# Patient Record
Sex: Female | Born: 1955 | Race: Black or African American | Hispanic: No | Marital: Married | Smoking: Former smoker
Health system: Southern US, Community
[De-identification: ages and names within clinical notes are randomized; demographics above are authoritative.]

## PROBLEM LIST (undated history)

## (undated) DIAGNOSIS — K219 Gastro-esophageal reflux disease without esophagitis: Secondary | ICD-10-CM

## (undated) DIAGNOSIS — G4734 Idiopathic sleep related nonobstructive alveolar hypoventilation: Secondary | ICD-10-CM

## (undated) DIAGNOSIS — N879 Dysplasia of cervix uteri, unspecified: Secondary | ICD-10-CM

## (undated) DIAGNOSIS — U071 COVID-19: Secondary | ICD-10-CM

## (undated) DIAGNOSIS — I2699 Other pulmonary embolism without acute cor pulmonale: Secondary | ICD-10-CM

## (undated) DIAGNOSIS — I10 Essential (primary) hypertension: Secondary | ICD-10-CM

## (undated) DIAGNOSIS — E785 Hyperlipidemia, unspecified: Secondary | ICD-10-CM

## (undated) DIAGNOSIS — M1990 Unspecified osteoarthritis, unspecified site: Secondary | ICD-10-CM

## (undated) DIAGNOSIS — J1282 Pneumonia due to coronavirus disease 2019: Secondary | ICD-10-CM

## (undated) DIAGNOSIS — M793 Panniculitis, unspecified: Secondary | ICD-10-CM

## (undated) DIAGNOSIS — R7303 Prediabetes: Secondary | ICD-10-CM

## (undated) DIAGNOSIS — F410 Panic disorder [episodic paroxysmal anxiety] without agoraphobia: Secondary | ICD-10-CM

## (undated) HISTORY — DX: Panic disorder (episodic paroxysmal anxiety): F41.0

## (undated) HISTORY — PX: THYROIDECTOMY, PARTIAL: SHX18

## (undated) HISTORY — PX: ABDOMINAL HYSTERECTOMY: SHX81

## (undated) HISTORY — DX: Hyperlipidemia, unspecified: E78.5

## (undated) HISTORY — PX: OTHER SURGICAL HISTORY: SHX169

## (undated) HISTORY — PX: CARPAL TUNNEL RELEASE: SHX101

## (undated) HISTORY — DX: Essential (primary) hypertension: I10

---

## 2020-03-23 ENCOUNTER — Other Ambulatory Visit: Payer: Self-pay | Admitting: Surgery

## 2020-03-24 ENCOUNTER — Other Ambulatory Visit (HOSPITAL_COMMUNITY): Payer: Self-pay | Admitting: Surgery

## 2020-03-31 ENCOUNTER — Ambulatory Visit (HOSPITAL_COMMUNITY): Payer: Self-pay

## 2020-03-31 ENCOUNTER — Encounter (HOSPITAL_COMMUNITY): Payer: Self-pay

## 2020-05-10 ENCOUNTER — Other Ambulatory Visit: Payer: Self-pay

## 2020-05-10 ENCOUNTER — Encounter: Payer: Medicare PPO | Attending: General Surgery | Admitting: Skilled Nursing Facility1

## 2020-05-10 ENCOUNTER — Encounter: Payer: Self-pay | Admitting: Skilled Nursing Facility1

## 2020-05-10 DIAGNOSIS — E669 Obesity, unspecified: Secondary | ICD-10-CM | POA: Diagnosis not present

## 2020-05-10 NOTE — Progress Notes (Signed)
Nutrition Assessment for Bariatric Surgery Medical Nutrition Therapy Appt Start Time: 7:30 End Time: 8:36  Patient was seen on 05/10/2020 for Pre-Operative Nutrition Assessment. Letter of approval faxed to [facility] Surgery bariatric surgery program coordinator on 05/10/2020.   Referral stated Supervised Weight Loss (SWL) visits needed: 0  Pt completed visits.  Pt has cleared nutrition requirements.  Planned surgery: sleeve gastrectomy  Pt expectation of surgery: to lose weight  Pt expectation of dietitian: to tell me how to eat    NUTRITION ASSESSMENT   Anthropometrics  Start weight at [facility]: 243.2 lbs (date: 05/10/2020)  Height: 62 in BMI: 44.48 kg/m2     Clinical  Medical hx: panic attacks, HTN Medications: see list Labs: no updates in EMR Notable signs/symptoms: hip pain, knee pain Any previous deficiencies? No  Micronutrient Nutrition Focused Physical Exam: Hair: No issues observed Eyes: No issues observed Mouth: No issues observed Neck: No issues observed Nails: No issues observed Skin: No issues observed  Lifestyle & Dietary Hx  Pt arrives with her sister and states she has her and her husband as a support. Pt states she did try baked fish and she really liked it. Pt states she realizes when she eats fried food she feels bloated.  Pt states she has already been working on chewing well and not drinking with meals.  24-Hr Dietary Recall First Meal: yogurt  Snack: cereal or oatmeal Second Meal: chicken salad sandwich or tuna sandwich + chips Snack: dry cereal Third Meal: greens, mashed potatoes, fried chicken  Snack: ice cream or chips Beverages: diet soda, juice, water   Estimated Energy Needs Calories: 1500   NUTRITION DIAGNOSIS  Overweight/obesity ([place]-3.3) related to past poor dietary habits and physical inactivity as evidenced by patient w/ planned sleeve gastrectomy surgery following dietary guidelines for continued weight loss.     NUTRITION INTERVENTION  Nutrition counseling (C-1) and education (E-2) to facilitate bariatric surgery goals. Educated pt on the caloric difference between defined snacks and meals as well as showed her the label of cranberry juice equaling to the calories of a meal.  Educated pt on following the cues of her body when she feels bloated after eating fried foods that is an indication it is not healthy for your body Educated pt on how to read the label and what to look for inclusive of high fructose corn syrup    Pre-Op Goals Reviewed with the Patient . Track food and beverage intake (pen and paper, MyFitness Pal, Baritastic app, etc.) . Make healthy food choices while monitoring portion sizes . Consume 3 meals per day or try to eat every 3-5 hours . Avoid concentrated sugars and fried foods . Keep sugar & fat in the single digits per serving on food labels . Practice CHEWING your food (aim for applesauce consistency) . Practice not drinking 15 minutes before, during, and 30 minutes after each meal and snack . Avoid all carbonated beverages (ex: soda, sparkling beverages)  . Limit caffeinated beverages (ex: coffee, tea, energy drinks) . Avoid all sugar-sweetened beverages (ex: regular soda, sports drinks)  . Avoid alcohol  . Aim for 64-100 ounces of FLUID daily (with at least half of fluid intake being plain water)  . Aim for at least 60-80 grams of PROTEIN daily . Look for a liquid protein source that contains ?15 g protein and ?5 g carbohydrate (ex: shakes, drinks, shots) . Make a list of non-food related activities . Physical activity is an important part of a healthy lifestyle so keep  it moving! The goal is to reach 150 minutes of exercise per week, including cardiovascular and weight baring activity.  *Goals that are bolded indicate the pt would like to start working towards these  Handouts Provided Include  . Bariatric Surgery handouts (Nutrition Visits, Pre-Op Goals, Protein  Shakes, Vitamins & Minerals)  Learning Style & Readiness for Change Teaching method utilized: Visual & Auditory  Demonstrated degree of understanding via: Teach Back  Readiness Level: Action Barriers to learning/adherence to lifestyle change: none identified      MONITORING & EVALUATION Dietary intake, weekly physical activity, body weight, and pre-op goals reached at next nutrition visit.    Next Steps  Patient is to follow up at [facility] for Pre-Op Class >2 weeks before surgery for further nutrition education.  Pt has completed visits. No further supervised visits required/recomended

## 2020-08-29 ENCOUNTER — Other Ambulatory Visit: Payer: Self-pay

## 2020-08-29 ENCOUNTER — Ambulatory Visit (HOSPITAL_COMMUNITY)
Admission: RE | Admit: 2020-08-29 | Discharge: 2020-08-29 | Disposition: A | Discharge status: Home / Self Care | Payer: Medicare PPO | Source: Ambulatory Visit | Attending: Surgery | Admitting: Surgery

## 2020-09-27 ENCOUNTER — Other Ambulatory Visit: Payer: Self-pay

## 2020-09-27 ENCOUNTER — Ambulatory Visit (INDEPENDENT_AMBULATORY_CARE_PROVIDER_SITE_OTHER): Payer: Medicare PPO | Admitting: Psychology

## 2020-10-11 ENCOUNTER — Ambulatory Visit (INDEPENDENT_AMBULATORY_CARE_PROVIDER_SITE_OTHER): Payer: Medicare PPO | Admitting: Psychology

## 2020-10-31 ENCOUNTER — Encounter: Payer: Medicare PPO | Attending: General Surgery | Admitting: Skilled Nursing Facility1

## 2020-10-31 ENCOUNTER — Other Ambulatory Visit: Payer: Self-pay

## 2020-10-31 DIAGNOSIS — E669 Obesity, unspecified: Secondary | ICD-10-CM

## 2020-10-31 DIAGNOSIS — Z713 Dietary counseling and surveillance: Secondary | ICD-10-CM | POA: Diagnosis not present

## 2020-10-31 NOTE — Progress Notes (Signed)
Pre-Operative Nutrition Class:    Patient was seen on 10/31/2020 for Pre-Operative Bariatric Surgery Education at [facility]    Surgery date: 12/05/2020 Surgery type: sleeve Start weight at [facility]: 243.2 Weight today: 259.7 pounds  Samples given per MNT protocol. Patient educated on appropriate usage: Bariatric Advantage Multivitamin Lot # [id number] Exp: 08/23   Procare Calcium  Lot # [id number] Exp: 03/23   Bariatric Advantage protein powder Lot # [id number] Exp: 10/23  The following the learning objectives were met by the patient during this course: Identify Pre-Op Dietary Goals and will begin 2 weeks pre-operatively Identify appropriate sources of fluids and proteins  State protein recommendations and appropriate sources pre and post-operatively Identify Post-Operative Dietary Goals and will follow for 2 weeks post-operatively Identify appropriate multivitamin and calcium sources Describe the need for physical activity post-operatively and will follow MD recommendations State when to call healthcare provider regarding medication questions or post-operative complications When having a diagnosis of diabetes understanding hypoglycemia symptoms and the inclusion of 1 complex carbohydrate per meal  Handouts given during class include: Pre-Op Bariatric Surgery Diet Handout Protein Shake Handout Post-Op Bariatric Surgery Nutrition Handout BELT Program Information Flyer Support Group Information Flyer [facility] Bariatric Supplements Price List  Follow-Up Plan: Patient will follow-up at [facility] 2 weeks post operatively for diet advancement per MD.

## 2020-11-07 ENCOUNTER — Ambulatory Visit: Payer: Medicare PPO

## 2020-11-14 NOTE — Progress Notes (Signed)
Sent message, via epic in basket, requesting orders in epic from surgeon.  

## 2020-11-22 ENCOUNTER — Ambulatory Visit: Payer: Self-pay | Admitting: Surgery

## 2020-11-23 NOTE — Patient Instructions (Addendum)
DUE TO COVID-19 ONLY ONE VISITOR IS ALLOWED TO COME WITH YOU AND STAY IN THE WAITING ROOM ONLY DURING PRE OP AND PROCEDURE.   **NO VISITORS ARE ALLOWED IN THE SHORT STAY AREA OR RECOVERY ROOM!!**  IF YOU WILL BE ADMITTED INTO THE HOSPITAL YOU ARE ALLOWED ONLY TWO SUPPORT PEOPLE DURING VISITATION HOURS ONLY (7 AM -8PM)    Up to two visitors ages 58+ are allowed at one time in a patient's room.  The visitors may rotate out with other people throughout the day.  Additionally, up to two children between the ages of 11 and 39 are allowed and do not count toward the number of allowed visitors.  Children within this age range must be accompanied by an adult visitor.  One adult visitor may remain with the patient overnight and must be in the room by 8 PM.  COVID SWAB TESTING MUST BE COMPLETED ON: Thursday, 12-01-20,  Between the hours of 8 and 3  **MUST PRESENT COMPLETED FORM AT TESTING SITE**    [address] [place] (backside of the building)  You are not required to quarantine, however you are required to wear a well-fitted mask when you are out and around people not in your household.  Hand Hygiene often Do NOT share personal items Notify your provider if you are in close contact with someone who has COVID or you develop fever 100.4 or greater, new onset of sneezing, cough, sore throat, shortness of breath or body aches.        Your procedure is scheduled on: Monday, 12-05-20   Report to [facility] Main  Entrance     Report to admitting at 5:15 AM   Call this number if you have problems the morning of surgery [phone]   Do not eat food :After 6:00 PM.   May have liquids until 4:15 AM day of surgery  CLEAR LIQUID DIET  Foods Allowed                                                                     Foods Excluded  Water, Black Coffee (no milk/no creamer) and tea, regular and decaf                              liquids that you cannot  Plain Jell-O in any  flavor  (No red)                         see through such as: Fruit ices (not with fruit pulp)                                 milk, soups, orange juice  Iced Popsicles (No red)                                    All solid food                             Apple juices  Sports drinks like Gatorade (No red) Lightly seasoned clear broth or consume(fat free) Sugar    Complete one G2 drink the morning of surgery at 4:15 AM the day of surgery.       The day of surgery:  Drink ONE (1) Pre-Surgery G2 the morning of surgery. Drink in one sitting. Do not sip.  This drink was given to you during your hospital  pre-op appointment visit. Nothing else to drink after completing the Pre-Surgery G2.          If you have questions, please contact your surgeon's office.     Oral Hygiene is also important to reduce your risk of infection.                                    Remember - BRUSH YOUR TEETH THE MORNING OF SURGERY WITH YOUR REGULAR TOOTHPASTE   Do NOT smoke after Midnight   Take these medicines the morning of surgery with A SIP OF WATER:  Amlodipine, Metoprolol, Pantoprazole                    Xarelto - Patient to check to see when to stop   Stop all vitamins and herbal supplements a week before surgery             You may not have any metal on your body including hair pins, jewelry, and body piercing             Do not wear make-up, lotions, powders, perfumes or deodorant  Do not wear nail polish including gel and S&S, artificial/acrylic nails, or any other type of covering on natural nails including finger and toenails. If you have artificial nails, gel coating, etc. that needs to be removed by a nail salon please have this removed prior to surgery or surgery may need to be canceled/ delayed if the surgeon/ anesthesia feels like they are unable to be safely monitored.   Do not shave  48 hours prior to surgery.   Do not bring valuables to the hospital. [facility] IS NOT RESPONSIBLE FOR  VALUABLES.   Contacts, dentures or bridgework may not be worn into surgery.   Bring small overnight bag day of surgery.   Special Instructions: Bring a copy of your healthcare power of attorney and living will documents the day of surgery if you haven't scanned them in before.  Please read over the following fact sheets you were given: IF YOU HAVE QUESTIONS ABOUT YOUR PRE OP INSTRUCTIONS PLEASE CALL [phone] [facility] - Preparing for Surgery Before surgery, you can play an important role.  Because skin is not sterile, your skin needs to be as free of germs as possible.  You can reduce the number of germs on your skin by washing with CHG (chlorahexidine gluconate) soap before surgery.  CHG is an antiseptic cleaner which kills germs and bonds with the skin to continue killing germs even after washing. Please DO NOT use if you have an allergy to CHG or antibacterial soaps.  If your skin becomes reddened/irritated stop using the CHG and inform your nurse when you arrive at Short Stay. Do not shave (including legs and underarms) for at least 48 hours prior to the first CHG shower.  You may shave your face/neck.  Please follow these instructions carefully:  1.  Shower with CHG Soap the night before surgery and  the  morning of surgery.  2.  If you choose to wash your hair, wash your hair first as usual with your normal  shampoo.  3.  After you shampoo, rinse your hair and body thoroughly to remove the shampoo.                             4.  Use CHG as you would any other liquid soap.  You can apply chg directly to the skin and wash.  Gently with a scrungie or clean washcloth.  5.  Apply the CHG Soap to your body ONLY FROM THE NECK DOWN.   Do   not use on face/ open                           Wound or open sores. Avoid contact with eyes, ears mouth and   genitals (private parts).                       Wash face,  Genitals (private parts) with your normal soap.             6.  Wash  thoroughly, paying special attention to the area where your    surgery  will be performed.  7.  Thoroughly rinse your body with warm water from the neck down.  8.  DO NOT shower/wash with your normal soap after using and rinsing off the CHG Soap.                9.  Pat yourself dry with a clean towel.            10.  Wear clean pajamas.            11.  Place clean sheets on your bed the night of your first shower and do not  sleep with pets. Day of Surgery : Do not apply any lotions/deodorants the morning of surgery.  Please wear clean clothes to the hospital/surgery center.  FAILURE TO FOLLOW THESE INSTRUCTIONS MAY RESULT IN THE CANCELLATION OF YOUR SURGERY  PATIENT SIGNATURE_________________________________  NURSE SIGNATURE__________________________________  ________________________________________________________________________   Gabriela Rodriguez  An incentive spirometer is a tool that can help keep your lungs clear and active. This tool measures how well you are filling your lungs with each breath. Taking long deep breaths may help reverse or decrease the chance of developing breathing (pulmonary) problems (especially infection) following: A long period of time when you are unable to move or be active. BEFORE THE PROCEDURE  If the spirometer includes an indicator to show your best effort, your nurse or respiratory therapist will set it to a desired goal. If possible, sit up straight or lean slightly forward. Try not to slouch. Hold the incentive spirometer in an upright position. INSTRUCTIONS FOR USE  Sit on the edge of your bed if possible, or sit up as far as you can in bed or on a chair. Hold the incentive spirometer in an upright position. Breathe out normally. Place the mouthpiece in your mouth and seal your lips tightly around it. Breathe in slowly and as deeply as possible, raising the piston or the ball toward the top of the column. Hold your breath for 3-5 seconds or  for as long as possible. Allow the piston or ball to fall to the bottom of the column. Remove the mouthpiece from your mouth and breathe out  normally. Rest for a few seconds and repeat Steps 1 through 7 at least 10 times every 1-2 hours when you are awake. Take your time and take a few normal breaths between deep breaths. The spirometer may include an indicator to show your best effort. Use the indicator as a goal to work toward during each repetition. After each set of 10 deep breaths, practice coughing to be sure your lungs are clear. If you have an incision (the cut made at the time of surgery), support your incision when coughing by placing a pillow or rolled up towels firmly against it. Once you are able to get out of bed, walk around indoors and cough well. You may stop using the incentive spirometer when instructed by your caregiver.  RISKS AND COMPLICATIONS Take your time so you do not get dizzy or light-headed. If you are in pain, you may need to take or ask for pain medication before doing incentive spirometry. It is harder to take a deep breath if you are having pain. AFTER USE Rest and breathe slowly and easily. It can be helpful to keep track of a log of your progress. Your caregiver can provide you with a simple table to help with this. If you are using the spirometer at home, follow these instructions: SEEK MEDICAL CARE IF:  You are having difficultly using the spirometer. You have trouble using the spirometer as often as instructed. Your pain medication is not giving enough relief while using the spirometer. You develop fever of 100.5 F (38.1 C) or higher. SEEK IMMEDIATE MEDICAL CARE IF:  You cough up bloody sputum that had not been present before. You develop fever of 102 F (38.9 C) or greater. You develop worsening pain at or near the incision site. MAKE SURE YOU:  Understand these instructions. Will watch your condition. Will get help right away if you are not doing  well or get worse. Document Released: 05/28/2006 Document Revised: 04/09/2011 Document Reviewed: 07/29/2006 Exit[employer] Patient Information 2014 Exit[employer], [place].   ________________________________________________________________________  WHAT IS A BLOOD TRANSFUSION? Blood Transfusion Information  A transfusion is the replacement of blood or some of its parts. Blood is made up of multiple cells which provide different functions. Red blood cells carry oxygen and are used for blood loss replacement. White blood cells fight against infection. Platelets control bleeding. Plasma helps clot blood. Other blood products are available for specialized needs, such as hemophilia or other clotting disorders. BEFORE THE TRANSFUSION  Who gives blood for transfusions?  Healthy volunteers who are fully evaluated to make sure their blood is safe. This is blood bank blood. Transfusion therapy is the safest it has ever been in the practice of medicine. Before blood is taken from a donor, a complete history is taken to make sure that person has no history of diseases nor engages in risky social behavior (examples are intravenous drug use or sexual activity with multiple partners). The donor's travel history is screened to minimize risk of transmitting infections, such as malaria. The donated blood is tested for signs of infectious diseases, such as HIV and hepatitis. The blood is then tested to be sure it is compatible with you in order to minimize the chance of a transfusion reaction. If you or a relative donates blood, this is often done in anticipation of surgery and is not appropriate for emergency situations. It takes many days to process the donated blood. RISKS AND COMPLICATIONS Although transfusion therapy is very safe and saves many lives, the main  dangers of transfusion include:  Getting an infectious disease. Developing a transfusion reaction. This is an allergic reaction to something in the blood you were  given. Every precaution is taken to prevent this. The decision to have a blood transfusion has been considered carefully by your caregiver before blood is given. Blood is not given unless the benefits outweigh the risks. AFTER THE TRANSFUSION Right after receiving a blood transfusion, you will usually feel much better and more energetic. This is especially true if your red blood cells have gotten low (anemic). The transfusion raises the level of the red blood cells which carry oxygen, and this usually causes an energy increase. The nurse administering the transfusion will monitor you carefully for complications. HOME CARE INSTRUCTIONS  No special instructions are needed after a transfusion. You may find your energy is better. Speak with your caregiver about any limitations on activity for underlying diseases you may have. SEEK MEDICAL CARE IF:  Your condition is not improving after your transfusion. You develop redness or irritation at the intravenous (IV) site. SEEK IMMEDIATE MEDICAL CARE IF:  Any of the following symptoms occur over the next 12 hours: Shaking chills. You have a temperature by mouth above 102 F (38.9 C), not controlled by medicine. Chest, back, or muscle pain. People around you feel you are not acting correctly or are confused. Shortness of breath or difficulty breathing. Dizziness and fainting. You get a rash or develop hives. You have a decrease in urine output. Your urine turns a dark color or changes to pink, red, or brown. Any of the following symptoms occur over the next 10 days: You have a temperature by mouth above 102 F (38.9 C), not controlled by medicine. Shortness of breath. Weakness after normal activity. The white part of the eye turns yellow (jaundice). You have a decrease in the amount of urine or are urinating less often. Your urine turns a dark color or changes to pink, red, or brown. Document Released: 01/13/2000 Document Revised: 04/09/2011  Document Reviewed: 09/01/2007 [facility] Patient Information 2014 [place], [place].  _______________________________________________________________________

## 2020-11-23 NOTE — Progress Notes (Addendum)
COVID swab appointment: 12-01-20   COVID Vaccine Completed:  Yes x2 Date COVID Vaccine completed:  03-07-19 04-04-19 Has received booster:  Yes x2 11-21-19 08-05-20 COVID vaccine manufacturer:  Moderna     Date of COVID positive in last 90 days: No  PCP - Arlina Robes, MD in [place] (notes requested) Fax# [phone]  Cardiologist - Dr. Daryel November, [place].  Notes requested Fax# [phone]  Pulmonologist - Dr. Wandra Mannan (notes on chart) Fax# [phone]  Chest x-ray - 11-25-20 Epic EKG - 08-29-20 Epic Stress Test - Unsure of date ECHO - Unsure if test done Cardiac Cath - N/A Pacemaker/ICD device last checked: Spinal Cord Stimulator:  Sleep Study - Yes, neg sleep apnea CPAP - No  Fasting Blood Sugar - Prediabetes Checks Blood Sugar - does not check   Blood Thinner Instructions: Xarelto.  Patient to check to see when to stop Aspirin Instructions: Last Dose:  Activity level:  Can go up a flight of stairs and perform activities of daily living without stopping and without symptoms of chest pain or shortness of breath.  Patient states that she does get winded with climbing stairs but thinks that is from being overweight and due to long Covid.    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  Hx of shortness of breath.  Patient is followed by cardiology, pulmonology.   HTN, preDM  Patient denies shortness of breath, fever, cough and chest pain at PAT appointment   Patient verbalized understanding of instructions that were given to them at the PAT appointment. Patient was also instructed that they will need to review over the PAT instructions again at home before surgery.

## 2020-11-25 ENCOUNTER — Ambulatory Visit (HOSPITAL_COMMUNITY)
Admission: RE | Admit: 2020-11-25 | Discharge: 2020-11-25 | Disposition: A | Discharge status: Home / Self Care | Payer: Medicare PPO | Source: Ambulatory Visit | Attending: Anesthesiology | Admitting: Anesthesiology

## 2020-11-25 ENCOUNTER — Other Ambulatory Visit: Payer: Self-pay

## 2020-11-25 ENCOUNTER — Encounter (HOSPITAL_COMMUNITY): Payer: Self-pay

## 2020-11-25 ENCOUNTER — Encounter (HOSPITAL_COMMUNITY)
Admission: RE | Admit: 2020-11-25 | Discharge: 2020-11-25 | Disposition: A | Discharge status: Home / Self Care | Payer: Medicare PPO | Source: Ambulatory Visit | Attending: Surgery | Admitting: Surgery

## 2020-11-25 VITALS — BP 126/76 | HR 80 | Temp 98.2°F | Resp 20 | Ht 62.0 in | Wt 259.8 lb

## 2020-11-25 DIAGNOSIS — Z01818 Encounter for other preprocedural examination: Secondary | ICD-10-CM | POA: Insufficient documentation

## 2020-11-25 DIAGNOSIS — R7303 Prediabetes: Secondary | ICD-10-CM | POA: Diagnosis present

## 2020-11-25 HISTORY — DX: Pneumonia due to coronavirus disease 2019: J12.82

## 2020-11-25 HISTORY — DX: COVID-19: U07.1

## 2020-11-25 HISTORY — DX: Prediabetes: R73.03

## 2020-11-25 HISTORY — DX: Gastro-esophageal reflux disease without esophagitis: K21.9

## 2020-11-25 HISTORY — DX: Other pulmonary embolism without acute cor pulmonale: I26.99

## 2020-11-25 HISTORY — DX: Idiopathic sleep related nonobstructive alveolar hypoventilation: G47.34

## 2020-11-25 HISTORY — DX: Unspecified osteoarthritis, unspecified site: M19.90

## 2020-11-25 HISTORY — DX: Panniculitis, unspecified: M79.3

## 2020-11-25 HISTORY — DX: Dysplasia of cervix uteri, unspecified: N87.9

## 2020-11-25 LAB — COMPREHENSIVE METABOLIC PANEL
ALT: 50 U/L — ABNORMAL HIGH (ref 0–44)
AST: 57 U/L — ABNORMAL HIGH (ref 15–41)
Albumin: 3.8 g/dL (ref 3.5–5.0)
Alkaline Phosphatase: 60 U/L (ref 38–126)
Anion gap: 11 (ref 5–15)
BUN: 11 mg/dL (ref 8–23)
CO2: 26 mmol/L (ref 22–32)
Calcium: 9.3 mg/dL (ref 8.9–10.3)
Chloride: 102 mmol/L (ref 98–111)
Creatinine, Ser: 0.39 mg/dL — ABNORMAL LOW (ref 0.44–1.00)
GFR, Estimated: >60 mL/min (ref >60)
Glucose, Bld: 82 mg/dL (ref 70–99)
Potassium: 3.8 mmol/L (ref 3.5–5.1)
Sodium: 139 mmol/L (ref 135–145)
Total Bilirubin: 0.4 mg/dL (ref 0.3–1.2)
Total Protein: 7.2 g/dL (ref 6.5–8.1)

## 2020-11-25 LAB — CBC WITH DIFFERENTIAL/PLATELET
Abs Immature Granulocytes: 0.08 10*3/uL — ABNORMAL HIGH (ref 0.00–0.07)
Basophils Absolute: 0 10*3/uL (ref 0.0–0.1)
Basophils Relative: 0 %
Eosinophils Absolute: 0.1 10*3/uL (ref 0.0–0.5)
Eosinophils Relative: 1 %
HCT: 42.9 % (ref 36.0–46.0)
Hemoglobin: 13.2 g/dL (ref 12.0–15.0)
Immature Granulocytes: 1 %
Lymphocytes Relative: 39 %
Lymphs Abs: 3.8 10*3/uL (ref 0.7–4.0)
MCH: 26.8 pg (ref 26.0–34.0)
MCHC: 30.8 g/dL (ref 30.0–36.0)
MCV: 87.2 fL (ref 80.0–100.0)
Monocytes Absolute: 0.7 10*3/uL (ref 0.1–1.0)
Monocytes Relative: 7 %
Neutro Abs: 5.1 10*3/uL (ref 1.7–7.7)
Neutrophils Relative %: 52 %
Platelets: 448 10*3/uL — ABNORMAL HIGH (ref 150–400)
RBC: 4.92 MIL/uL (ref 3.87–5.11)
RDW: 14.7 % (ref 11.5–15.5)
WBC: 9.9 10*3/uL (ref 4.0–10.5)
nRBC: 0 % (ref 0.0–0.2)

## 2020-11-25 LAB — GLUCOSE, CAPILLARY: Glucose-Capillary: 88 mg/dL (ref 70–99)

## 2020-11-25 NOTE — Progress Notes (Signed)
   11/25/20 1357  OBSTRUCTIVE SLEEP APNEA  Have you ever been diagnosed with sleep apnea through a sleep study? No  Do you snore loudly (loud enough to be heard through closed doors)?  1  Do you often feel tired, fatigued, or sleepy during the daytime (such as falling asleep during driving or talking to someone)? 0  Has anyone observed you stop breathing during your sleep? 0  Do you have, or are you being treated for high blood pressure? 1  BMI more than 35 kg/m2? 1  Age > 50 (1-yes) 1  Neck circumference greater than:Female 16 inches or larger, Female 17inches or larger? 1  Female Gender (Yes=1) 0  Obstructive Sleep Apnea Score 5  Score 5 or greater  Results sent to PCP

## 2020-11-26 LAB — HEMOGLOBIN A1C
Hgb A1c MFr Bld: 5.9 % — ABNORMAL HIGH (ref 4.8–5.6)
Mean Plasma Glucose: 122.63 mg/dL

## 2020-11-28 ENCOUNTER — Encounter (HOSPITAL_COMMUNITY): Payer: Self-pay

## 2020-11-30 NOTE — Progress Notes (Addendum)
Anesthesia Chart Review   Case: [id number] Date/Time: 12/05/20 0700   Procedures:      XI ROBOTIC GASTRIC SLEEVE RESECTION     UPPER GI ENDOSCOPY   Anesthesia type: General   Pre-op diagnosis: morbid obesity   Location: [facility] ROOM 02 / [facility]   Surgeons: Luretha Murphy, MD       DISCUSSION:65 y.o. former smoker with h/o HTN, GERD, h/o COVID wit subsequent PE (on Xarelto), nocturnal hypoxemia with 2L O2 at night, morbid obesity scheduled for above procedure 12/05/2020 with Dr. Luretha Murphy.   Anticipate pt can proceed with planned procedure barring acute status change.   VS: BP 126/76   Pulse 80   Temp 36.8 C (Oral)   Resp 20   Ht 5\' 2"  (1.575 m)   Wt 117.8 kg   SpO2 97%   BMI 47.52 kg/m   PROVIDERS: , MD is PCP    LABS: Labs reviewed: Acceptable for surgery. (all labs ordered are listed, but only abnormal results are displayed)  Labs Reviewed  CBC WITH DIFFERENTIAL/PLATELET - Abnormal; Notable for the following components:      Result Value   Platelets 448 (*)    Abs Immature Granulocytes 0.08 (*)    All other components within normal limits  COMPREHENSIVE METABOLIC PANEL - Abnormal; Notable for the following components:   Creatinine, Ser 0.39 (*)    AST 57 (*)    ALT 50 (*)    All other components within normal limits  HEMOGLOBIN A1C - Abnormal; Notable for the following components:   Hgb A1c MFr Bld 5.9 (*)    All other components within normal limits  GLUCOSE, CAPILLARY  TYPE AND SCREEN     IMAGES: Chest Xray 11/25/2020 IMPRESSION: 1. No acute cardiopulmonary disease. 2. Irregularity in the region of the anterior left third rib and suspect this is postoperative in nature based on the history.  EKG: 08/29/20 Rate 103 bpm  Sinus tachycardia Minimal voltage criteria for LVH, may be normal variant ( R in aVL ) Anteroseptal infarct , age undetermined T wave abnormality, consider lateral ischemia No previous tracing  CV:  Past  Medical History:  Diagnosis Date   Arthritis    Cervical dysplasia    COVID-19    GERD (gastroesophageal reflux disease)    Hyperlipidemia    Hypertension    Nocturnal hypoxemia    Panic attacks    Panniculitis    Pneumonia due to COVID-19 virus    Pre-diabetes    Pulmonary embolism (HCC)     Past Surgical History:  Procedure Laterality Date   ABDOMINAL HYSTERECTOMY     CARPAL TUNNEL RELEASE     CESAREAN SECTION     hip replacemnt     left lung tumor     THYROIDECTOMY, PARTIAL      MEDICATIONS:  celecoxib (CELEBREX) 200 MG capsule   amitriptyline (ELAVIL) 75 MG tablet   amLODipine (NORVASC) 10 MG tablet   Aspirin-Acetaminophen-Caffeine (GOODY HEADACHE PO)   atorvastatin (LIPITOR) 20 MG tablet   clotrimazole-betamethasone (LOTRISONE) cream   cyclobenzaprine (FLEXERIL) 10 MG tablet   estradiol (ESTRACE) 0.5 MG tablet   furosemide (LASIX) 20 MG tablet   metoprolol tartrate (LOPRESSOR) 25 MG tablet   OXYGEN   pantoprazole (PROTONIX) 40 MG tablet   Vitamin D, Ergocalciferol, (DRISDOL) 1.25 MG (50000 UNIT) CAPS capsule   XARELTO 20 MG TABS tablet   No current facility-administered medications for this encounter.    10/29/20 Ward, PA-C Jodell Cipro  Pre-Surgical Testing [phone]

## 2020-12-01 ENCOUNTER — Other Ambulatory Visit: Payer: Self-pay | Admitting: Surgery

## 2020-12-01 LAB — SARS CORONAVIRUS 2 (TAT 6-24 HRS): SARS Coronavirus 2: NEGATIVE

## 2020-12-04 NOTE — Anesthesia Preprocedure Evaluation (Addendum)
Anesthesia Evaluation  Patient identified by MRN, date of birth, ID band  Reviewed: Allergy & Precautions, NPO status , Patient's Chart, lab work & pertinent test results  Airway Mallampati: III  TM Distance: >3 FB Neck ROM: Full    Dental no notable dental hx.    Pulmonary Recent URI  (COVID on home O2 at night), Resolved, former smoker,    Pulmonary exam normal breath sounds clear to auscultation       Cardiovascular Exercise Tolerance: Good hypertension, Pt. on medications Normal cardiovascular exam Rhythm:Regular Rate:Normal  H.o PE on xarelto   Neuro/Psych Anxiety    GI/Hepatic GERD  ,  Endo/Other  Morbid obesity  Renal/GU      Musculoskeletal  (+) Arthritis , Osteoarthritis,    Abdominal (+) + obese,   Peds  Hematology   Anesthesia Other Findings   Reproductive/Obstetrics                            Anesthesia Physical Anesthesia Plan  ASA: 4  Anesthesia Plan: General   Post-op Pain Management:    Induction: Intravenous  PONV Risk Score and Plan: Scopolamine patch - Pre-op, Treatment may vary due to age or medical condition, Midazolam, Ondansetron and Dexamethasone  Airway Management Planned: Oral ETT  Additional Equipment: None  Intra-op Plan:   Post-operative Plan: Extubation in OR  Informed Consent: I have reviewed the patients History and Physical, chart, labs and discussed the procedure including the risks, benefits and alternatives for the proposed anesthesia with the patient or authorized representative who has indicated his/her understanding and acceptance.     Dental advisory given  Plan Discussed with: CRNA, Anesthesiologist and Surgeon  Anesthesia Plan Comments:        Anesthesia Quick Evaluation

## 2020-12-05 ENCOUNTER — Encounter (HOSPITAL_COMMUNITY): Payer: Self-pay | Admitting: Surgery

## 2020-12-05 ENCOUNTER — Inpatient Hospital Stay (HOSPITAL_COMMUNITY): Payer: Medicare PPO | Admitting: Certified Registered Nurse Anesthetist

## 2020-12-05 ENCOUNTER — Inpatient Hospital Stay (HOSPITAL_COMMUNITY): Payer: Medicare PPO | Admitting: Physician Assistant

## 2020-12-05 ENCOUNTER — Other Ambulatory Visit: Payer: Self-pay

## 2020-12-05 ENCOUNTER — Inpatient Hospital Stay (HOSPITAL_COMMUNITY)
Admission: RE | Admit: 2020-12-05 | Discharge: 2020-12-06 | DRG: 621 | Disposition: A | Discharge status: Home / Self Care | Payer: Medicare PPO | Attending: Surgery | Admitting: Surgery

## 2020-12-05 ENCOUNTER — Encounter (HOSPITAL_COMMUNITY)
Admission: RE | Disposition: A | Discharge status: Home / Self Care | Payer: Self-pay | Source: Home / Self Care | Attending: Surgery

## 2020-12-05 DIAGNOSIS — F419 Anxiety disorder, unspecified: Secondary | ICD-10-CM | POA: Diagnosis present

## 2020-12-05 DIAGNOSIS — E119 Type 2 diabetes mellitus without complications: Secondary | ICD-10-CM | POA: Diagnosis present

## 2020-12-05 DIAGNOSIS — Z8249 Family history of ischemic heart disease and other diseases of the circulatory system: Secondary | ICD-10-CM

## 2020-12-05 DIAGNOSIS — Z87891 Personal history of nicotine dependence: Secondary | ICD-10-CM | POA: Diagnosis not present

## 2020-12-05 DIAGNOSIS — K219 Gastro-esophageal reflux disease without esophagitis: Secondary | ICD-10-CM | POA: Diagnosis present

## 2020-12-05 DIAGNOSIS — I10 Essential (primary) hypertension: Secondary | ICD-10-CM | POA: Diagnosis present

## 2020-12-05 DIAGNOSIS — Z833 Family history of diabetes mellitus: Secondary | ICD-10-CM

## 2020-12-05 DIAGNOSIS — Z79899 Other long term (current) drug therapy: Secondary | ICD-10-CM | POA: Diagnosis not present

## 2020-12-05 DIAGNOSIS — Z7901 Long term (current) use of anticoagulants: Secondary | ICD-10-CM | POA: Diagnosis not present

## 2020-12-05 DIAGNOSIS — Z96643 Presence of artificial hip joint, bilateral: Secondary | ICD-10-CM | POA: Diagnosis present

## 2020-12-05 DIAGNOSIS — Z7982 Long term (current) use of aspirin: Secondary | ICD-10-CM

## 2020-12-05 DIAGNOSIS — Z20822 Contact with and (suspected) exposure to covid-19: Secondary | ICD-10-CM | POA: Diagnosis present

## 2020-12-05 DIAGNOSIS — Z6841 Body Mass Index (BMI) 40.0 and over, adult: Secondary | ICD-10-CM

## 2020-12-05 DIAGNOSIS — Z791 Long term (current) use of non-steroidal anti-inflammatories (NSAID): Secondary | ICD-10-CM

## 2020-12-05 DIAGNOSIS — E7800 Pure hypercholesterolemia, unspecified: Secondary | ICD-10-CM | POA: Diagnosis present

## 2020-12-05 DIAGNOSIS — Z9884 Bariatric surgery status: Secondary | ICD-10-CM

## 2020-12-05 DIAGNOSIS — M1990 Unspecified osteoarthritis, unspecified site: Secondary | ICD-10-CM | POA: Diagnosis present

## 2020-12-05 DIAGNOSIS — M793 Panniculitis, unspecified: Secondary | ICD-10-CM | POA: Diagnosis present

## 2020-12-05 HISTORY — PX: UPPER GI ENDOSCOPY: SHX6162

## 2020-12-05 LAB — CBC
HCT: 40.5 % (ref 36.0–46.0)
Hemoglobin: 12.6 g/dL (ref 12.0–15.0)
MCH: 27.1 pg (ref 26.0–34.0)
MCHC: 31.1 g/dL (ref 30.0–36.0)
MCV: 87.1 fL (ref 80.0–100.0)
Platelets: 389 10*3/uL (ref 150–400)
RBC: 4.65 MIL/uL (ref 3.87–5.11)
RDW: 14.7 % (ref 11.5–15.5)
WBC: 16.8 10*3/uL — ABNORMAL HIGH (ref 4.0–10.5)
nRBC: 0 % (ref 0.0–0.2)

## 2020-12-05 LAB — GLUCOSE, CAPILLARY: Glucose-Capillary: 98 mg/dL (ref 70–99)

## 2020-12-05 LAB — HEMOGLOBIN AND HEMATOCRIT, BLOOD
HCT: 41.2 % (ref 36.0–46.0)
Hemoglobin: 12.9 g/dL (ref 12.0–15.0)

## 2020-12-05 LAB — ABO/RH: ABO/RH(D): O NEG

## 2020-12-05 LAB — TYPE AND SCREEN
ABO/RH(D): O NEG
Antibody Screen: NEGATIVE

## 2020-12-05 LAB — CREATININE, SERUM
Creatinine, Ser: 0.63 mg/dL (ref 0.44–1.00)
GFR, Estimated: >60 mL/min (ref >60)

## 2020-12-05 SURGERY — XI ROBOTIC GASTRIC SLEEVE RESECTION
Anesthesia: General | Site: Esophagus

## 2020-12-05 MED ORDER — KCL IN DEXTROSE-NACL 20-5-0.45 MEQ/L-%-% IV SOLN
INTRAVENOUS | Status: DC
  Filled 2020-12-05 (×3): qty 1000

## 2020-12-05 MED ORDER — ACETAMINOPHEN 160 MG/5ML PO SOLN: 1000.0000 mg | Freq: Three times a day (TID) | ORAL | Status: DC

## 2020-12-05 MED ORDER — MORPHINE SULFATE (PF) 4 MG/ML IV SOLN: 1.0000 mg | INTRAVENOUS | Status: DC | PRN

## 2020-12-05 MED ORDER — BUPIVACAINE LIPOSOME 1.3 % IJ SUSP: 20.0000 mL | Freq: Once | INTRAMUSCULAR | Status: DC

## 2020-12-05 MED ORDER — SODIUM CHLORIDE (PF) 0.9 % IJ SOLN
INTRAMUSCULAR | Status: AC
  Filled 2020-12-05: qty 10

## 2020-12-05 MED ORDER — PHENYLEPHRINE 40 MCG/ML (10ML) SYRINGE FOR IV PUSH (FOR BLOOD PRESSURE SUPPORT)
PREFILLED_SYRINGE | INTRAVENOUS | Status: AC
  Filled 2020-12-05: qty 10

## 2020-12-05 MED ORDER — PANTOPRAZOLE SODIUM 40 MG IV SOLR
40.0000 mg | Freq: Every day | INTRAVENOUS | Status: DC
  Administered 2020-12-05: 40 mg via INTRAVENOUS
  Filled 2020-12-05: qty 40

## 2020-12-05 MED ORDER — FENTANYL CITRATE (PF) 100 MCG/2ML IJ SOLN
INTRAMUSCULAR | Status: AC
  Filled 2020-12-05: qty 2

## 2020-12-05 MED ORDER — LACTATED RINGERS IV SOLN: INTRAVENOUS | Status: DC

## 2020-12-05 MED ORDER — KETAMINE HCL 10 MG/ML IJ SOLN
INTRAMUSCULAR | Status: AC
  Filled 2020-12-05: qty 1

## 2020-12-05 MED ORDER — HEPARIN SODIUM (PORCINE) 5000 UNIT/ML IJ SOLN
5000.0000 [IU] | Freq: Three times a day (TID) | INTRAMUSCULAR | Status: DC
  Administered 2020-12-05 – 2020-12-06 (×4): 5000 [IU] via SUBCUTANEOUS
  Filled 2020-12-05 (×4): qty 1

## 2020-12-05 MED ORDER — MIDAZOLAM HCL 5 MG/5ML IJ SOLN
INTRAMUSCULAR | Status: DC | PRN
  Administered 2020-12-05: 2 mg via INTRAVENOUS

## 2020-12-05 MED ORDER — CEFOTETAN DISODIUM 2 G IJ SOLR
2.0000 g | INTRAMUSCULAR | Status: AC
  Administered 2020-12-05: 2 g via INTRAVENOUS
  Filled 2020-12-05: qty 2

## 2020-12-05 MED ORDER — OXYCODONE HCL 5 MG PO TABS: 5.0000 mg | ORAL_TABLET | Freq: Once | ORAL | Status: DC | PRN

## 2020-12-05 MED ORDER — LACTATED RINGERS IR SOLN
Status: DC | PRN
  Administered 2020-12-05: 1000 mL

## 2020-12-05 MED ORDER — LIDOCAINE 2% (20 MG/ML) 5 ML SYRINGE
INTRAMUSCULAR | Status: DC | PRN
  Administered 2020-12-05: 80 mg via INTRAVENOUS

## 2020-12-05 MED ORDER — HEPARIN SODIUM (PORCINE) 5000 UNIT/ML IJ SOLN
5000.0000 [IU] | INTRAMUSCULAR | Status: AC
  Administered 2020-12-05: 5000 [IU] via SUBCUTANEOUS
  Filled 2020-12-05: qty 1

## 2020-12-05 MED ORDER — PROMETHAZINE HCL 25 MG/ML IJ SOLN
6.2500 mg | INTRAMUSCULAR | Status: DC | PRN
  Administered 2020-12-05: 6.25 mg via INTRAVENOUS

## 2020-12-05 MED ORDER — LIDOCAINE 20MG/ML (2%) 15 ML SYRINGE OPTIME
INTRAMUSCULAR | Status: DC | PRN
  Administered 2020-12-05: 1.5 mg/kg/h via INTRAVENOUS

## 2020-12-05 MED ORDER — SODIUM CHLORIDE (PF) 0.9 % IJ SOLN
INTRAMUSCULAR | Status: DC | PRN
  Administered 2020-12-05: 10 mL

## 2020-12-05 MED ORDER — ROCURONIUM BROMIDE 10 MG/ML (PF) SYRINGE
PREFILLED_SYRINGE | INTRAVENOUS | Status: AC
  Filled 2020-12-05: qty 10

## 2020-12-05 MED ORDER — MIDAZOLAM HCL 2 MG/2ML IJ SOLN
INTRAMUSCULAR | Status: AC
  Filled 2020-12-05: qty 2

## 2020-12-05 MED ORDER — CHLORHEXIDINE GLUCONATE 0.12 % MT SOLN
15.0000 mL | Freq: Once | OROMUCOSAL | Status: AC
  Administered 2020-12-05: 15 mL via OROMUCOSAL

## 2020-12-05 MED ORDER — SCOPOLAMINE 1 MG/3DAYS TD PT72
1.0000 | MEDICATED_PATCH | TRANSDERMAL | Status: DC
  Administered 2020-12-05: 1.5 mg via TRANSDERMAL
  Filled 2020-12-05: qty 1

## 2020-12-05 MED ORDER — ROCURONIUM BROMIDE 10 MG/ML (PF) SYRINGE
PREFILLED_SYRINGE | INTRAVENOUS | Status: DC | PRN
  Administered 2020-12-05: 10 mg via INTRAVENOUS
  Administered 2020-12-05: 20 mg via INTRAVENOUS
  Administered 2020-12-05: 10 mg via INTRAVENOUS
  Administered 2020-12-05: 80 mg via INTRAVENOUS
  Administered 2020-12-05: 20 mg via INTRAVENOUS
  Administered 2020-12-05: 10 mg via INTRAVENOUS

## 2020-12-05 MED ORDER — ONDANSETRON HCL 4 MG/2ML IJ SOLN
INTRAMUSCULAR | Status: DC | PRN
  Administered 2020-12-05: 4 mg via INTRAVENOUS

## 2020-12-05 MED ORDER — DEXAMETHASONE SODIUM PHOSPHATE 10 MG/ML IJ SOLN
INTRAMUSCULAR | Status: DC | PRN
  Administered 2020-12-05: 6 mg via INTRAVENOUS

## 2020-12-05 MED ORDER — ACETAMINOPHEN 500 MG PO TABS
1000.0000 mg | ORAL_TABLET | ORAL | Status: AC
  Administered 2020-12-05: 1000 mg via ORAL
  Filled 2020-12-05: qty 2

## 2020-12-05 MED ORDER — AMLODIPINE BESYLATE 10 MG PO TABS
10.0000 mg | ORAL_TABLET | Freq: Every day | ORAL | Status: DC
  Administered 2020-12-06: 10 mg via ORAL
  Filled 2020-12-05: qty 1

## 2020-12-05 MED ORDER — CHLORHEXIDINE GLUCONATE CLOTH 2 % EX PADS: 6.0000 | MEDICATED_PAD | Freq: Once | CUTANEOUS | Status: DC

## 2020-12-05 MED ORDER — FENTANYL CITRATE PF 50 MCG/ML IJ SOSY
25.0000 ug | PREFILLED_SYRINGE | INTRAMUSCULAR | Status: DC | PRN
  Administered 2020-12-05 (×2): 25 ug via INTRAVENOUS

## 2020-12-05 MED ORDER — SUGAMMADEX SODIUM 500 MG/5ML IV SOLN
INTRAVENOUS | Status: DC | PRN
  Administered 2020-12-05: 360 mg via INTRAVENOUS

## 2020-12-05 MED ORDER — APREPITANT 40 MG PO CAPS
40.0000 mg | ORAL_CAPSULE | ORAL | Status: AC
  Administered 2020-12-05: 40 mg via ORAL
  Filled 2020-12-05: qty 1

## 2020-12-05 MED ORDER — FENTANYL CITRATE (PF) 100 MCG/2ML IJ SOLN
INTRAMUSCULAR | Status: DC | PRN
  Administered 2020-12-05 (×4): 50 ug via INTRAVENOUS

## 2020-12-05 MED ORDER — BUPIVACAINE LIPOSOME 1.3 % IJ SUSP
INTRAMUSCULAR | Status: AC
  Filled 2020-12-05: qty 20

## 2020-12-05 MED ORDER — FENTANYL CITRATE PF 50 MCG/ML IJ SOSY
PREFILLED_SYRINGE | INTRAMUSCULAR | Status: AC
  Administered 2020-12-05: 25 ug via INTRAVENOUS
  Filled 2020-12-05: qty 1

## 2020-12-05 MED ORDER — AMISULPRIDE (ANTIEMETIC) 5 MG/2ML IV SOLN
10.0000 mg | Freq: Once | INTRAVENOUS | Status: AC | PRN
  Administered 2020-12-05: 10 mg via INTRAVENOUS

## 2020-12-05 MED ORDER — LIDOCAINE HCL (PF) 2 % IJ SOLN
INTRAMUSCULAR | Status: AC
  Filled 2020-12-05: qty 20

## 2020-12-05 MED ORDER — ACETAMINOPHEN 500 MG PO TABS
1000.0000 mg | ORAL_TABLET | Freq: Three times a day (TID) | ORAL | Status: DC
  Administered 2020-12-05 – 2020-12-06 (×4): 1000 mg via ORAL
  Filled 2020-12-05 (×4): qty 2

## 2020-12-05 MED ORDER — ENSURE MAX PROTEIN PO LIQD
2.0000 [oz_av] | ORAL | Status: DC
  Administered 2020-12-06 (×4): 2 [oz_av] via ORAL

## 2020-12-05 MED ORDER — EPHEDRINE 5 MG/ML INJ
INTRAVENOUS | Status: AC
  Filled 2020-12-05: qty 5

## 2020-12-05 MED ORDER — METOPROLOL TARTRATE 25 MG PO TABS
25.0000 mg | ORAL_TABLET | Freq: Every day | ORAL | Status: DC
  Administered 2020-12-06: 25 mg via ORAL
  Filled 2020-12-05: qty 1

## 2020-12-05 MED ORDER — PROPOFOL 10 MG/ML IV BOLUS
INTRAVENOUS | Status: DC | PRN
  Administered 2020-12-05: 180 mg via INTRAVENOUS

## 2020-12-05 MED ORDER — FENTANYL CITRATE PF 50 MCG/ML IJ SOSY
PREFILLED_SYRINGE | INTRAMUSCULAR | Status: AC
  Administered 2020-12-05: 25 ug
  Filled 2020-12-05: qty 1

## 2020-12-05 MED ORDER — EPHEDRINE SULFATE-NACL 50-0.9 MG/10ML-% IV SOSY
PREFILLED_SYRINGE | INTRAVENOUS | Status: DC | PRN
  Administered 2020-12-05: 5 mg via INTRAVENOUS

## 2020-12-05 MED ORDER — ONDANSETRON HCL 4 MG/2ML IJ SOLN
INTRAMUSCULAR | Status: AC
  Filled 2020-12-05: qty 2

## 2020-12-05 MED ORDER — ORAL CARE MOUTH RINSE: 15.0000 mL | Freq: Once | OROMUCOSAL | Status: AC

## 2020-12-05 MED ORDER — ONDANSETRON HCL 4 MG/2ML IJ SOLN
4.0000 mg | INTRAMUSCULAR | Status: DC | PRN
  Administered 2020-12-05: 4 mg via INTRAVENOUS
  Filled 2020-12-05: qty 2

## 2020-12-05 MED ORDER — 0.9 % SODIUM CHLORIDE (POUR BTL) OPTIME
TOPICAL | Status: DC | PRN
  Administered 2020-12-05: 1000 mL

## 2020-12-05 MED ORDER — PROMETHAZINE HCL 25 MG/ML IJ SOLN
INTRAMUSCULAR | Status: AC
  Filled 2020-12-05: qty 1

## 2020-12-05 MED ORDER — LIDOCAINE HCL (PF) 2 % IJ SOLN
INTRAMUSCULAR | Status: AC
  Filled 2020-12-05: qty 5

## 2020-12-05 MED ORDER — OXYCODONE HCL 5 MG/5ML PO SOLN: 5.0000 mg | Freq: Once | ORAL | Status: DC | PRN

## 2020-12-05 MED ORDER — PHENYLEPHRINE HCL-NACL 20-0.9 MG/250ML-% IV SOLN
INTRAVENOUS | Status: AC
  Filled 2020-12-05: qty 250

## 2020-12-05 MED ORDER — DEXAMETHASONE SODIUM PHOSPHATE 10 MG/ML IJ SOLN
INTRAMUSCULAR | Status: AC
  Filled 2020-12-05: qty 1

## 2020-12-05 MED ORDER — BUPIVACAINE LIPOSOME 1.3 % IJ SUSP
INTRAMUSCULAR | Status: DC | PRN
  Administered 2020-12-05: 20 mL

## 2020-12-05 MED ORDER — PROPOFOL 10 MG/ML IV BOLUS
INTRAVENOUS | Status: AC
  Filled 2020-12-05: qty 40

## 2020-12-05 MED ORDER — SUGAMMADEX SODIUM 500 MG/5ML IV SOLN
INTRAVENOUS | Status: AC
  Filled 2020-12-05: qty 5

## 2020-12-05 MED ORDER — KETAMINE HCL 10 MG/ML IJ SOLN
INTRAMUSCULAR | Status: DC | PRN
  Administered 2020-12-05: 25 mg via INTRAVENOUS

## 2020-12-05 MED ORDER — AMISULPRIDE (ANTIEMETIC) 5 MG/2ML IV SOLN
INTRAVENOUS | Status: AC
  Filled 2020-12-05: qty 4

## 2020-12-05 MED ORDER — OXYCODONE HCL 5 MG/5ML PO SOLN
5.0000 mg | Freq: Four times a day (QID) | ORAL | Status: DC | PRN
  Administered 2020-12-05 – 2020-12-06 (×4): 5 mg via ORAL
  Filled 2020-12-05 (×4): qty 5

## 2020-12-05 MED ORDER — PHENYLEPHRINE 40 MCG/ML (10ML) SYRINGE FOR IV PUSH (FOR BLOOD PRESSURE SUPPORT)
PREFILLED_SYRINGE | INTRAVENOUS | Status: DC | PRN
  Administered 2020-12-05: 80 ug via INTRAVENOUS

## 2020-12-05 SURGICAL SUPPLY — 71 items
ADH SKN CLS APL DERMABOND .7 (GAUZE/BANDAGES/DRESSINGS) ×2
APL PRP STRL LF DISP 70% ISPRP (MISCELLANEOUS) ×2
APPLIER CLIP 5 13 M/L LIGAMAX5 (MISCELLANEOUS)
APPLIER CLIP ROT 10 11.4 M/L (STAPLE)
APR CLP MED LRG 11.4X10 (STAPLE)
APR CLP MED LRG 5 ANG JAW (MISCELLANEOUS)
BLADE SURG 15 STRL LF DISP TIS (BLADE) ×2 IMPLANT
BLADE SURG 15 STRL SS (BLADE) ×3
CANNULA REDUC XI 12-8 STAPL (CANNULA) ×3
CANNULA REDUCER 12-8 DVNC XI (CANNULA) ×2 IMPLANT
CHLORAPREP W/TINT 26 (MISCELLANEOUS) ×3 IMPLANT
CLIP APPLIE 5 13 M/L LIGAMAX5 (MISCELLANEOUS) IMPLANT
CLIP APPLIE ROT 10 11.4 M/L (STAPLE) IMPLANT
COVER SURGICAL LIGHT HANDLE (MISCELLANEOUS) ×3 IMPLANT
DECANTER SPIKE VIAL GLASS SM (MISCELLANEOUS) ×2 IMPLANT
DERMABOND ADVANCED (GAUZE/BANDAGES/DRESSINGS) ×1
DERMABOND ADVANCED .7 DNX12 (GAUZE/BANDAGES/DRESSINGS) ×2 IMPLANT
DRAPE ARM DVNC X/XI (DISPOSABLE) ×8 IMPLANT
DRAPE COLUMN DVNC XI (DISPOSABLE) ×2 IMPLANT
DRAPE DA VINCI XI ARM (DISPOSABLE) ×12
DRAPE DA VINCI XI COLUMN (DISPOSABLE) ×3
ELECT REM PT RETURN 15FT ADLT (MISCELLANEOUS) ×3 IMPLANT
GLOVE SURG ENC MOIS LTX SZ8 (GLOVE) ×6 IMPLANT
GOWN STRL REUS W/TWL XL LVL3 (GOWN DISPOSABLE) ×9 IMPLANT
GRASPER SUT TROCAR 14GX15 (MISCELLANEOUS) ×3 IMPLANT
IRRIG SUCT STRYKERFLOW 2 WTIP (MISCELLANEOUS) ×3
IRRIGATION SUCT STRKRFLW 2 WTP (MISCELLANEOUS) ×2 IMPLANT
KIT BASIN OR (CUSTOM PROCEDURE TRAY) ×3 IMPLANT
KIT TURNOVER KIT A (KITS) IMPLANT
LUBRICANT JELLY K Y 4OZ (MISCELLANEOUS) IMPLANT
MARKER SKIN DUAL TIP RULER LAB (MISCELLANEOUS) ×3 IMPLANT
MAT PREVALON FULL STRYKER (MISCELLANEOUS) ×3 IMPLANT
NDL SPNL 22GX3.5 QUINCKE BK (NEEDLE) ×2 IMPLANT
NEEDLE SPNL 22GX3.5 QUINCKE BK (NEEDLE) ×3 IMPLANT
OBTURATOR OPTICAL STANDARD 8MM (TROCAR) ×3
OBTURATOR OPTICAL STND 8 DVNC (TROCAR) ×2
OBTURATOR OPTICALSTD 8 DVNC (TROCAR) ×2 IMPLANT
PACK CARDIOVASCULAR III (CUSTOM PROCEDURE TRAY) ×3 IMPLANT
RELOAD STAPLE 60 2.5 WHT DVNC (STAPLE) IMPLANT
RELOAD STAPLE 60 3.5 BLU DVNC (STAPLE) IMPLANT
RELOAD STAPLER 2.5X60 WHT DVNC (STAPLE) ×10 IMPLANT
RELOAD STAPLER 3.5X60 BLU DVNC (STAPLE) ×4 IMPLANT
SCISSORS LAP 5X35 DISP (ENDOMECHANICALS) IMPLANT
SEAL CANN UNIV 5-8 DVNC XI (MISCELLANEOUS) ×6 IMPLANT
SEAL XI 5MM-8MM UNIVERSAL (MISCELLANEOUS) ×9
SEALER VESSEL DA VINCI XI (MISCELLANEOUS) ×3
SEALER VESSEL EXT DVNC XI (MISCELLANEOUS) ×2 IMPLANT
SLEEVE GASTRECTOMY 36FR VISIGI (MISCELLANEOUS) ×3 IMPLANT
SOL ANTI FOG 6CC (MISCELLANEOUS) ×2 IMPLANT
SOLUTION ANTI FOG 6CC (MISCELLANEOUS) ×1
SOLUTION ELECTROLUBE (MISCELLANEOUS) ×3 IMPLANT
SPONGE T-LAP 18X18 ~~LOC~~+RFID (SPONGE) ×3 IMPLANT
STAPLER 60 DA VINCI SURE FORM (STAPLE) ×6
STAPLER 60 SUREFORM DVNC (STAPLE) ×2 IMPLANT
STAPLER CANNULA SEAL DVNC XI (STAPLE) ×2 IMPLANT
STAPLER CANNULA SEAL XI (STAPLE) ×3
STAPLER RELOAD 2.5X60 WHITE (STAPLE) ×15
STAPLER RELOAD 2.5X60 WHT DVNC (STAPLE) ×10
STAPLER RELOAD 3.5X60 BLU DVNC (STAPLE) ×4
STAPLER RELOAD 3.5X60 BLUE (STAPLE) ×6
SUT ETHIBOND 0 36 GRN (SUTURE) IMPLANT
SUT MNCRL AB 4-0 PS2 18 (SUTURE) ×6 IMPLANT
SUT VICRYL 0 TIES 12 18 (SUTURE) ×3 IMPLANT
SYR 10ML ECCENTRIC (SYRINGE) IMPLANT
SYR 20ML LL LF (SYRINGE) ×3 IMPLANT
TOWEL OR 17X26 10 PK STRL BLUE (TOWEL DISPOSABLE) ×3 IMPLANT
TRAY FOLEY MTR SLVR 16FR STAT (SET/KITS/TRAYS/PACK) IMPLANT
TROCAR ADV FIXATION 5X100MM (TROCAR) IMPLANT
TROCAR BLADELESS OPT 5 100 (ENDOMECHANICALS) ×3 IMPLANT
TUBE CALIBRATION LAPBAND (TUBING) IMPLANT
TUBING INSUFFLATION 10FT LAP (TUBING) ×3 IMPLANT

## 2020-12-05 NOTE — Interval H&P Note (Signed)
History and Physical Interval Note:  12/05/2020 7:14 AM  Gabriela Rodriguez  has presented today for surgery, with the diagnosis of morbid obesity.  The various methods of treatment have been discussed with the patient and family. After consideration of risks, benefits and other options for treatment, the patient has consented to  Procedure(s): XI ROBOTIC GASTRIC SLEEVE RESECTION (N/A) UPPER GI ENDOSCOPY (N/A) as a surgical intervention.  The patient's history has been reviewed, patient examined, no change in status, stable for surgery.  I have reviewed the patient's chart and labs.  Questions were answered to the patient's satisfaction.     Valarie Merino

## 2020-12-05 NOTE — Anesthesia Procedure Notes (Signed)
Procedure Name: Intubation Date/Time: 12/05/2020 7:30 AM Performed by: West Pugh, CRNA Pre-anesthesia Checklist: Patient identified, Emergency Drugs available, Suction available, Patient being monitored and Timeout performed Patient Re-evaluated:Patient Re-evaluated prior to induction Oxygen Delivery Method: Circle system utilized Preoxygenation: Pre-oxygenation with 100% oxygen Induction Type: IV induction Ventilation: Mask ventilation without difficulty Laryngoscope Size: Mac and 3 Grade View: Grade I Tube type: Oral Tube size: 7.0 mm Number of attempts: 1 Airway Equipment and Method: Stylet Placement Confirmation: ETT inserted through vocal cords under direct vision, positive ETCO2, CO2 detector and breath sounds checked- equal and bilateral Secured at: 20 cm Tube secured with: Tape Dental Injury: Teeth and Oropharynx as per pre-operative assessment

## 2020-12-05 NOTE — Progress Notes (Signed)
Patient arrives to the unit and voids once being in room. She c/o of no pain and only nausea. Zofran given. She demonstrates IS use and has walked to bathroom and back to chair.

## 2020-12-05 NOTE — Progress Notes (Signed)
PHARMACY CONSULT FOR:  Risk Assessment for Post-Discharge VTE Following Bariatric Surgery  Post-Discharge VTE Risk Assessment: This patient's probability of 30-day post-discharge VTE is increased due to the factors marked:   Female   X Age >/=60 years    BMI >/=50 kg/m2    CHF    Dyspnea at Rest    Paraplegia   X Non-gastric-band surgery    Operation Time >/=3 hr    Return to OR     Length of Stay >/= 3 d  X Hx of VTE    Hypercoagulable condition   Significant venous stasis       Predicted probability of 30-day post-discharge VTE: see recom section below   Recommendation for Discharge: Patient has hx PE on xarelto 20mg  daily PTA.   Resume full-dose anticoagulation as soon as medically safe per surgeon assessment.    Given limited published data on reliability of DOAC absorption in this setting, will await MD decision on whether to use enoxaparin vs resume DOAC.     Gabriela Rodriguez is a 65 y.o. female who underwent robotic sleeve gastrectomy on 12/05/20.   Case start: 0756 Case end: 0956   Allergies  Allergen Reactions   Ciprofloxacin Hives    GI Upset    Patient Measurements: Weight: 118 kg (260 lb 1.6 oz) Body mass index is 47.57 kg/m.  No results for input(s): WBC, HGB, HCT, PLT, APTT, CREATININE, LABCREA, CREATININE, CREAT24HRUR, MG, PHOS, ALBUMIN, PROT, ALBUMIN, AST, ALT, ALKPHOS, BILITOT, BILIDIR, IBILI in the last 72 hours. Estimated Creatinine Clearance: 86.7 mL/min (A) (by C-G formula based on SCr of 0.39 mg/dL (L)).    Past Medical History:  Diagnosis Date   Arthritis    Cervical dysplasia    COVID-19    GERD (gastroesophageal reflux disease)    Hyperlipidemia    Hypertension    Nocturnal hypoxemia    Panic attacks    Panniculitis    Pneumonia due to COVID-19 virus    Pre-diabetes    Pulmonary embolism (HCC)      Medications Prior to Admission  Medication Sig Dispense Refill Last Dose   amitriptyline (ELAVIL) 75 MG tablet Take 75 mg by  mouth at bedtime as needed for sleep.   Past Week   amLODipine (NORVASC) 10 MG tablet Take 10 mg by mouth daily.   12/05/2020   Aspirin-Acetaminophen-Caffeine (GOODY HEADACHE PO) Take 1 packet by mouth daily as needed (headaches).   Past Week   atorvastatin (LIPITOR) 20 MG tablet Take 20 mg by mouth at bedtime.   12/04/2020   celecoxib (CELEBREX) 200 MG capsule Take 200 mg by mouth daily.   Past Month   clotrimazole-betamethasone (LOTRISONE) cream Apply 1 application topically 2 (two) times daily.   Past Month   cyclobenzaprine (FLEXERIL) 10 MG tablet Take 10-20 mg by mouth See admin instructions. May take 10 mg during the day and 20 mg at bedtime as needed for muscle spasms   Past Week   estradiol (ESTRACE) 0.5 MG tablet Take 0.5 mg by mouth daily.   Past Month   metoprolol tartrate (LOPRESSOR) 25 MG tablet Take 25 mg by mouth daily.   12/05/2020 at 0405   pantoprazole (PROTONIX) 40 MG tablet Take 40 mg by mouth daily before breakfast.   12/05/2020   Vitamin D, Ergocalciferol, (DRISDOL) 1.25 MG (50000 UNIT) CAPS capsule Take 50,000 Units by mouth every Monday.   Past Week   XARELTO 20 MG TABS tablet Take 20 mg by mouth daily with supper.  11/29/2020 at 1600   furosemide (LASIX) 20 MG tablet Take 20 mg by mouth daily as needed for edema.   More than a month   OXYGEN Inhale into the lungs.          Arlester Marker, Ashante Snelling P 12/05/2020,10:24 AM

## 2020-12-05 NOTE — Op Note (Signed)
   Surgeon: Wenda Low, MD, FACS  Asst:  Ivar Drape, MD @Date @ Anes:  General endotracheal  Procedure: Robotic sleeve gastrectomy and upper endoscopy  Diagnosis: Morbid obesity BMI 57  Complications: None noted  EBL:   minimal cc  Description of Procedure:  The patient was take to OR 2 and given general anesthesia.  The abdomen was prepped with Chloroprep and draped sterilely.  A timeout was performed.  Access to the abdomen was achieved with a 5 mm Optiview through the left upper quadrant.  Following insufflation, the state of the abdomen was found to be free of adhesions.   The liver was enlarged and two cysts were visible on the left lateral segment.  The ViSiGi 36Fr tube was inserted to deflate the stomach and was pulled back into the esophagus.  Four 8 mm trocars were placed including a 12 mm for the robotic stapler.    The pylorus was identified and we measured 6 cm back and marked the antrum.  At that point we began dissection to take down the greater curvature of the stomach using the vessel sealer.  This dissection was taken all the way up to the left crus.  Posterior attachments of the stomach were also taken down.    The ViSiGi tube was then passed into the antrum and suction applied so that it was snug along the lessor curvature.  The "crow's foot" or incisura was identified.  The sleeve gastrectomy was begun using the Sureform platform stapler beginning with a blue load x 2 and then white loads.  The final staples maintain some distance from the EG cardia to preserve the gastric circular fibers.  When the sleeve was complete the tube was taken off suction and insufflated briefly.  The tube was withdrawn.  Upper endoscopy was then performed by Dr. which showed and intact staple line and free passage to the antrum and visible pylorus.  No hiatal hernia was seen.     The specimen was extracted through the 15 trocar site.  Local block was provided by infiltrating  abdomen as a TAP block and then closed 4-0 Monocryl and Dermabond.    Matt B. Daphine Deutscher, MD, [facility], [facility] [place]

## 2020-12-05 NOTE — Progress Notes (Signed)

## 2020-12-05 NOTE — Progress Notes (Signed)
Patient has started protein drink.

## 2020-12-05 NOTE — H&P (Signed)
MRN: [id number] DOB: [birth year]   Chief Complaint: For robotic sleeve gastrectomy   History of Present Illness: Gabriela Rodriguez is a 65 y.o. female who is seen today as an office consultation at the request of Dr. Ardean Larsen for evaluation of Pre-op Exam .   Gabriela Rodriguez comes in today for preop check and discussion for her upcoming sleeve gastrectomy. We discussed doing this on the robotic platform. Her BMI today is 47.4. Weight is 251. Blood pressure 136/82. She is 5 feet 2 inches. Her main comorbidities have been hypertension and hypercholesterolemia with borderline diabetes.  She is recently had a bout of panniculitis all across her panniculus improved with clotrimazole and betamethasone. I encouraged her to get some cornstarch to use as well as a low-grade hair dryer and wear cotton. She has had 2 prior C-sections in that region but no upper abdominal surgery.  Her panniculitis responded to the Lotrimin cream.   She seems to be satisfied with our discussions and is ready to move forward with her surgery.  Review of Systems: See HPI as well for other ROS.  ROS   Medical History: History reviewed. No pertinent past medical history.  Patient Active Problem List  Diagnosis   Pain due to hip joint prosthesis (CMS-HCC)   BMI 36.0-36.9,adult   Status post bilateral total hip replacement   Essential hypertension   Tobacco use disorder   Past Surgical History:  Procedure Laterality Date   JOINT REPLACEMENT    Allergies  Allergen Reactions   Cipro [Ciprofloxacin] Other (See Comments)  Other Reaction: GI Upset   Current Outpatient Medications on File Prior to Visit  Medication Sig Dispense Refill   amLODIPine (NORVASC) 10 MG tablet   atorvastatin (LIPITOR) 20 MG tablet   ergocalciferol, vitamin D2, 1,250 mcg (50,000 unit) capsule   estradiol (ESTRACE) 0.5 MG tablet   FUROsemide (LASIX) 20 MG tablet   metoprolol tartrate (LOPRESSOR) 25 MG tablet   pantoprazole  (PROTONIX) 40 MG DR tablet   XARELTO 20 mg tablet   *amlodipine besylate oral (Patient not taking: Reported on 10/19/2020)   *nitroglycerin oral (Patient not taking: Reported on 10/19/2020)   aspirin 81 mg tablet (Patient not taking: Reported on 10/19/2020)   cyclobenzaprine (FLEXERIL) 10 MG tablet (Patient not taking: Reported on 10/19/2020)   lisinopril (PRINIVIL,ZESTRIL) 5 MG tablet (Patient not taking: Reported on 10/19/2020)   meloxicam (MOBIC) 15 MG tablet TAKE 1 TABLET BY MOUTH ONCE DAILY (Patient not taking: Reported on 10/19/2020) 30 tablet 0   oxyCODONE-acetaminophen (PERCOCET) 5-325 mg tablet tab by mouth as needed (Patient not taking: No sig reported)   PARoxetine (PAXIL) 20 MG tablet (Patient not taking: Reported on 10/19/2020)   predniSONE (DELTASONE) 20 MG tablet (Patient not taking: Reported on 10/19/2020)   PREMARIN 0.625 mg/gram vaginal cream (Patient not taking: Reported on 10/19/2020)   ranitidine (ZANTAC) 300 MG tablet (Patient not taking: No sig reported)   simvastatin (ZOCOR) 40 MG tablet (Patient not taking: No sig reported)   No current facility-administered medications on file prior to visit.   Family History  Problem Relation Age of Onset   High blood pressure (Hypertension) Mother   Coronary Artery Disease (Blocked arteries around heart) Father   High blood pressure (Hypertension) Father   Hyperlipidemia (Elevated cholesterol) Father   Diabetes Father   Coronary Artery Disease (Blocked arteries around heart) Sister   Obesity Sister   High blood pressure (Hypertension) Sister   Hyperlipidemia (Elevated cholesterol) Sister    Social History  Tobacco Use  Smoking Status Former Smoker   Packs/day: 10.00   Types: Cigarettes   Quit date: 10/19/2018   Years since quitting: 2.0  Smokeless Tobacco Never Used    Social History   Socioeconomic History   Marital status: Married  Tobacco Use   Smoking status: Former Smoker  Packs/day: 10.00  Types: Cigarettes   Quit date: 10/19/2018  Years since quitting: 2.0   Smokeless tobacco: Never Used  Vaping Use   Vaping Use: Unknown  Substance and Sexual Activity   Alcohol use: Never   Drug use: Never   Objective:   Vitals:  Pulse: 98  Temp: 36.5 C (97.7 F)  SpO2: 96%  Weight: (!) 117.6 kg (259 lb 3.2 oz)  Height: 157.5 cm (5\' 2" )   Body mass index is 47.41 kg/m.  Physical Exam General: Well maintained and appointed African-American female no acute distress HEENT : Unremarkable except glasses Chest: Clear to auscultation Heart: Sinus rhythm without murmurs or gallops Breast: Not examined Abdomen: Obese with obvious pannus and evidence of panniculitis covered--treated GU not examined Rectal not performed Extremities full range of motion. Her legs do not appear to be swollen. She has had prior DVT on Xarelto. PE with Covid in 2020 Neuro alert and oriented x3. Motor and sensory function grossly intact  Labs, Imaging and Diagnostic Testing: No hiatal hernia; some esophageal dysmotility on 09/19/20  Assessment and Plan:   Panniculitis  Morbid obesity with BMI of 45.0-49.9, adult (CMS-HCC)    This is a preop visit for Gabriela Rodriguez who is planning to have a sleeve gastrectomy. She seems to be excited and ready to move forward with a robotic sleeve gastrectomy.   Gabriela Buckingham Charna Busman, MD

## 2020-12-05 NOTE — Discharge Instructions (Signed)

## 2020-12-05 NOTE — Transfer of Care (Signed)
Immediate Anesthesia Transfer of Care Note  Patient: Gabriela Rodriguez  Procedure(s) Performed: XI ROBOTIC GASTRIC SLEEVE RESECTION (Abdomen) UPPER GI ENDOSCOPY (Esophagus)  Patient Location: PACU  Anesthesia Type:General  Level of Consciousness: awake, drowsy and patient cooperative  Airway & Oxygen Therapy: Patient Spontanous Breathing and Patient connected to face mask oxygen  Post-op Assessment: Report given to RN and Post -op Vital signs reviewed and stable  Post vital signs: Reviewed and stable  Last Vitals:  Vitals Value Taken Time  BP 112/56 12/05/20 1003  Temp    Pulse 92 12/05/20 1007  Resp 23 12/05/20 1007  SpO2 92 % 12/05/20 1007  Vitals shown include unvalidated device data.  Last Pain:  Vitals:   12/05/20 0547  TempSrc:   PainSc: 0-No pain         Complications: No notable events documented.

## 2020-12-05 NOTE — Anesthesia Postprocedure Evaluation (Signed)
Anesthesia Post Note  Patient: Gabriela Rodriguez  Procedure(s) Performed: XI ROBOTIC GASTRIC SLEEVE RESECTION (Abdomen) UPPER GI ENDOSCOPY (Esophagus)     Patient location during evaluation: PACU Anesthesia Type: General Level of consciousness: awake and alert Pain management: pain level controlled Vital Signs Assessment: post-procedure vital signs reviewed and stable Respiratory status: spontaneous breathing, nonlabored ventilation, respiratory function stable and patient connected to nasal cannula oxygen Cardiovascular status: blood pressure returned to baseline and stable Postop Assessment: no apparent nausea or vomiting Anesthetic complications: no   No notable events documented.  Last Vitals:  Vitals:   12/05/20 1208 12/05/20 1313  BP: (!) 122/56 (!) 154/71  Pulse: (!) 106 (!) 104  Resp: 17 18  Temp:  (!) 36.4 C  SpO2: 98% 97%    Last Pain:  Vitals:   12/05/20 1228  TempSrc:   PainSc: 0-No pain                 Mellody Dance

## 2020-12-05 NOTE — Progress Notes (Signed)
Patient started water @ 1225.

## 2020-12-06 ENCOUNTER — Encounter (HOSPITAL_COMMUNITY): Payer: Self-pay | Admitting: Surgery

## 2020-12-06 LAB — CBC WITH DIFFERENTIAL/PLATELET
Abs Immature Granulocytes: 0.1 10*3/uL — ABNORMAL HIGH (ref 0.00–0.07)
Basophils Absolute: 0 10*3/uL (ref 0.0–0.1)
Basophils Relative: 0 %
Eosinophils Absolute: 0 10*3/uL (ref 0.0–0.5)
Eosinophils Relative: 0 %
HCT: 38.1 % (ref 36.0–46.0)
Hemoglobin: 11.9 g/dL — ABNORMAL LOW (ref 12.0–15.0)
Immature Granulocytes: 1 %
Lymphocytes Relative: 25 %
Lymphs Abs: 3.3 10*3/uL (ref 0.7–4.0)
MCH: 26.9 pg (ref 26.0–34.0)
MCHC: 31.2 g/dL (ref 30.0–36.0)
MCV: 86 fL (ref 80.0–100.0)
Monocytes Absolute: 0.9 10*3/uL (ref 0.1–1.0)
Monocytes Relative: 7 %
Neutro Abs: 8.5 10*3/uL — ABNORMAL HIGH (ref 1.7–7.7)
Neutrophils Relative %: 67 %
Platelets: 386 10*3/uL (ref 150–400)
RBC: 4.43 MIL/uL (ref 3.87–5.11)
RDW: 14.6 % (ref 11.5–15.5)
WBC: 12.8 10*3/uL — ABNORMAL HIGH (ref 4.0–10.5)
nRBC: 0 % (ref 0.0–0.2)

## 2020-12-06 MED ORDER — PANTOPRAZOLE SODIUM 40 MG PO TBEC: 40.0000 mg | DELAYED_RELEASE_TABLET | Freq: Every day | ORAL | 0 refills | Status: AC

## 2020-12-06 MED ORDER — OXYCODONE HCL 5 MG PO TABS: 5.0000 mg | ORAL_TABLET | Freq: Four times a day (QID) | ORAL | 0 refills | Status: AC | PRN

## 2020-12-06 MED ORDER — ENOXAPARIN (LOVENOX) PATIENT EDUCATION KIT
PACK | Freq: Once | Status: AC
  Filled 2020-12-06: qty 1

## 2020-12-06 MED ORDER — ENOXAPARIN SODIUM 40 MG/0.4ML IJ SOSY: 40.0000 mg | PREFILLED_SYRINGE | Freq: Two times a day (BID) | INTRAMUSCULAR | 0 refills | Status: AC

## 2020-12-06 MED ORDER — ONDANSETRON 4 MG PO TBDP: 4.0000 mg | ORAL_TABLET | Freq: Four times a day (QID) | ORAL | 0 refills | Status: AC | PRN

## 2020-12-06 NOTE — Progress Notes (Signed)
Patient was given discharge instruction by Crystal, and all questions were answered. Patient was stable for discharge and was taken to the main exit by wheelchair.

## 2020-12-06 NOTE — Progress Notes (Signed)
Reviewed Lovenox teaching kit and pt reviewed Lovenox "Patient-Self Injection Video". Pt confirmed that pharmacy has medication ready for pickup.  All questions answered.

## 2020-12-06 NOTE — Progress Notes (Signed)
Patient alert and oriented, pain is controlled. Patient is tolerating fluids, advanced to protein shake today, patient is tolerating well.  Reviewed Gastric sleeve discharge instructions with patient and patient is able to articulate understanding.  Provided information on BELT program, Support Group and [facility] outpatient pharmacy. All questions answered, will continue to monitor.  

## 2020-12-06 NOTE — Discharge Summary (Signed)
Physician Discharge Summary  Patient ID: Gabriela Rodriguez MRN: [id number] DOB/AGE: 07-16-1955 65 y.o.  PCP: Arlina Robes, MD  Admit date: 12/05/2020 Discharge date: 12/06/2020  Admission Diagnoses:  morbid obesity  Discharge Diagnoses:  same  Active Problems:   S/P laparoscopic sleeve gastrectomy   Surgery:  robotic sleeve gastrectomy  Discharged Condition: stable and improved  Hospital Course:   had robotic sleeve gastrectomy on Monday.  Started clears and advanced.  Ready for discharge on Tuesday.  Will send home on Lovenox for 4 weeks and then restart xarelto  Consults: pharmacy  Significant Diagnostic Studies: none    Discharge Exam: Blood pressure 134/68, pulse 94, temperature 98.5 F (36.9 C), temperature source Oral, resp. rate 18, height 5\' 2"  (1.575 m), weight 118 kg, SpO2 98 %. Incisions healing OK.    Disposition: Discharge disposition: 01-Home or Self Care       Discharge Instructions     Ambulate hourly while awake   Complete by: As directed    Call MD for:  difficulty breathing, headache or visual disturbances   Complete by: As directed    Call MD for:  persistant dizziness or light-headedness   Complete by: As directed    Call MD for:  persistant nausea and vomiting   Complete by: As directed    Call MD for:  redness, tenderness, or signs of infection (pain, swelling, redness, odor or green/yellow discharge around incision site)   Complete by: As directed    Call MD for:  severe uncontrolled pain   Complete by: As directed    Call MD for:  temperature >101 F   Complete by: As directed    Diet bariatric full liquid   Complete by: As directed    Incentive spirometry   Complete by: As directed    Perform hourly while awake      Allergies as of 12/06/2020       Reactions   Ciprofloxacin Hives   GI Upset        Medication List     STOP taking these medications    celecoxib 200 MG capsule Commonly known as: CELEBREX    GOODY HEADACHE PO   OXYGEN   Xarelto 20 MG Tabs tablet Generic drug: rivaroxaban       TAKE these medications    amitriptyline 75 MG tablet Commonly known as: ELAVIL Take 75 mg by mouth at bedtime as needed for sleep.   amLODipine 10 MG tablet Commonly known as: NORVASC Take 10 mg by mouth daily. Notes to patient: Monitor Blood Pressure Daily and keep a log for primary care physician.  You may need to make changes to your medications with rapid weight loss.     atorvastatin 20 MG tablet Commonly known as: LIPITOR Take 20 mg by mouth at bedtime.   clotrimazole-betamethasone cream Commonly known as: LOTRISONE Apply 1 application topically 2 (two) times daily.   cyclobenzaprine 10 MG tablet Commonly known as: FLEXERIL Take 10-20 mg by mouth See admin instructions. May take 10 mg during the day and 20 mg at bedtime as needed for muscle spasms   enoxaparin 40 MG/0.4ML injection Commonly known as: LOVENOX Inject 0.4 mLs (40 mg total) into the skin 2 (two) times daily for 28 days.   estradiol 0.5 MG tablet Commonly known as: ESTRACE Take 0.5 mg by mouth daily.   furosemide 20 MG tablet Commonly known as: LASIX Take 20 mg by mouth daily as needed for edema. Notes to patient: Monitor Blood  Pressure Daily and keep a log for primary care physician.  Monitor for symptoms of dehydration.  You may need to make changes to your medications with rapid weight loss.     metoprolol tartrate 25 MG tablet Commonly known as: LOPRESSOR Take 25 mg by mouth daily. Notes to patient: Monitor Blood Pressure Daily and keep a log for primary care physician.  You may need to make changes to your medications with rapid weight loss.     ondansetron 4 MG disintegrating tablet Commonly known as: ZOFRAN-ODT Take 1 tablet (4 mg total) by mouth every 6 (six) hours as needed for nausea or vomiting.   oxyCODONE 5 MG immediate release tablet Commonly known as: Oxy IR/ROXICODONE Take 1 tablet (5 mg  total) by mouth every 6 (six) hours as needed for severe pain.   pantoprazole 40 MG tablet Commonly known as: PROTONIX Take 1 tablet (40 mg total) by mouth daily. What changed: when to take this   Vitamin D (Ergocalciferol) 1.25 MG (50000 UNIT) Caps capsule Commonly known as: DRISDOL Take 50,000 Units by mouth every Monday.        Follow-up Information     Luretha Murphy, MD. Go on 12/29/2020.   Specialty: General Surgery Why: at 10am at the [facility] OFFICE.  Please arrive 15 minutes prior to your appointment time.  Thank you. Contact information: [address] [place] [address] [phone]         Surgery, [place]. Go on 02/01/2021.   Specialty: General Surgery Why: at 9am with Dr. Daphine Deutscher.  Please arrive 15 minutes prior to your appointment time.  Thank you. Contact information: [address] [place] [address] [phone]                 Signed: Valarie Merino 12/06/2020, 12:03 PM

## 2020-12-06 NOTE — Progress Notes (Signed)
24hr fluid recall: 630mL. Per dehydration protocol, will call pt to f/u within one week post op. ?

## 2020-12-12 ENCOUNTER — Telehealth (HOSPITAL_COMMUNITY): Payer: Self-pay | Admitting: *Deleted

## 2020-12-12 NOTE — Telephone Encounter (Signed)
1.  Tell me about your pain and pain management? Pt denies any pain.  2.  Let's talk about fluid intake.  How much total fluid are you taking in? Pt states that she is working to meet goal of 64 oz of fluid today.  Pt has consumed at least 41fl oz today. Pt has consumed one protein shake, beef broth, water, and Powerade.  Pt plans to drink rest of protein, water, broth, for the remainder of the day to meet goal. Pt instructed to assess status and suggestions daily utilizing Hydration Action Plan on discharge folder and to call [facility] if in the "red zone".   3.  How much protein have you taken in the last 2 days? Pt states that she is working to meet goal of goal of 60g of protein today.  Pt has already consumed one protein shake(s).  Pt plans to drink remainder of protein throughout the rest of the day to meet goal.  4.  Have you had nausea?  Tell me about when have experienced nausea and what you did to help? Pt denies nausea.   5.  Has the frequency or color changed with your urine? Pt states that she is urinating "fine" with no changes in frequency or urgency.     6.  Tell me what your incisions look like? "Incisions look fine". Pt denies a fever, chills.  Pt states incisions are not swollen, open, or draining.  Pt encouraged to call [facility] if incisions change.   7.  Have you been passing gas? BM? Pt states that she is having BMs. Last BM 12/12/20.  Pt states that she was constipated over the weekend, so has taken 5 doses of Milk of Magnesia.  Now pt c/o of having 4 loose stools today.  Pt states that she has called [facility] and received instruction to not take any more MoM.   8.  If a problem or question were to arise who would you call?  Do you know contact numbers for [facility], [facility], and [facility]? Pt states that she has experienced some lightheaded and dizzy spells this morning, but "feels better since drinking beef broth".  Denies palpitations.  Pt states that she did not want me to call the office, but  wanted to try to get in more fluids herself.  Discussed with pt to f/u tomorrow if symptoms not improved.  Pt can describe s/sx of dehydration.  Pt knows to call [facility] for surgical, [facility] for nutrition, and [facility] for non-urgent questions or concerns.   9.  How has the walking going? Pt states she is walking around and able to be active without difficulty.   10. Are you still using your incentive spirometer?  If so, how often? Pt states that she uses the I.S. every hour.  Pt encouraged to use incentive spirometer, at least 10x every hour while awake until she sees the surgeon.  11.  How are your vitamins and calcium going?  How are you taking them? Pt states that she is taking her supplements and vitamins without difficulty.  Reminded patient that the first 30 days post-operatively are important for successful recovery.  Practice good hand hygiene, wearing a mask when appropriate (since optional in most places), and minimizing exposure to people who live outside of the home, especially if they are exhibiting any respiratory, GI, or illness-like symptoms.

## 2020-12-13 ENCOUNTER — Telehealth (HOSPITAL_COMMUNITY): Payer: Self-pay | Admitting: *Deleted

## 2020-12-13 NOTE — Telephone Encounter (Addendum)
Called pt to f/u after conversation yesterday.  Pt was only able to consume a total of 39 fl oz, but did meet 60g protein goal.  Today, pt has only been able to consume 21 fl oz which is similar to her timeline yesterday.  Due to not meet goals over the weekend, will f/u with [facility] office/ MD for IVF hydration.    Pt also states that she is tolerating her Lovenox injections and taking them twice daily without difficulty.

## 2020-12-14 LAB — SURGICAL PATHOLOGY

## 2020-12-20 ENCOUNTER — Other Ambulatory Visit: Payer: Self-pay

## 2020-12-20 ENCOUNTER — Encounter: Payer: Medicare PPO | Attending: General Surgery | Admitting: Skilled Nursing Facility1

## 2020-12-20 DIAGNOSIS — Z9884 Bariatric surgery status: Secondary | ICD-10-CM | POA: Diagnosis not present

## 2020-12-20 DIAGNOSIS — E669 Obesity, unspecified: Secondary | ICD-10-CM | POA: Insufficient documentation

## 2020-12-20 DIAGNOSIS — Z713 Dietary counseling and surveillance: Secondary | ICD-10-CM | POA: Diagnosis not present

## 2020-12-20 DIAGNOSIS — Z6841 Body Mass Index (BMI) 40.0 and over, adult: Secondary | ICD-10-CM | POA: Diagnosis not present

## 2020-12-20 NOTE — Progress Notes (Signed)
2 Week Post-Operative Nutrition Class   Patient was seen on 12/20/2020 for Post-Operative Nutrition education at the [facility].    Surgery date: 12/05/2020 Surgery type: sleeve Start weight at [facility]: 243.2 Weight today: 244.6 pounds Bowel Habits: Every day to every other day no complaints   Body Composition Scale 12/20/2020  Current Body Weight 244.6  Total Body Fat % 48.3  Visceral Fat 21  Fat-Free Mass % 51.6   Total Body Water % 40.3  Muscle-Mass lbs 27.4  BMI 44.6  Body Fat Displacement          Torso  lbs 73.2         Left Leg  lbs 14.6         Right Leg  lbs 14.6         Left Arm  lbs 7.3         Right Arm   lbs 7.3      The following the learning objectives were met by the patient during this course: Identifies Phase 3 (Soft, High Proteins) Dietary Goals and will begin from 2 weeks post-operatively to 2 months post-operatively Identifies appropriate sources of fluids and proteins  Identifies appropriate fat sources and healthy verses unhealthy fat types   States protein recommendations and appropriate sources post-operatively Identifies the need for appropriate texture modifications, mastication, and bite sizes when consuming solids Identifies appropriate fat consumption and sources Identifies appropriate multivitamin and calcium sources post-operatively Describes the need for physical activity post-operatively and will follow MD recommendations States when to call healthcare provider regarding medication questions or post-operative complications   Handouts given during class include: Phase 3A: Soft, High Protein Diet Handout Phase 3 High Protein Meals Healthy Fats   Follow-Up Plan: Patient will follow-up at [facility] in 6 weeks for 2 month post-op nutrition visit for diet advancement per MD.

## 2020-12-26 ENCOUNTER — Telehealth: Payer: Self-pay | Admitting: Skilled Nursing Facility1

## 2020-12-26 NOTE — Telephone Encounter (Signed)
RD called pt to verify fluid intake once starting soft, solid proteins 2 week post-bariatric surgery.   Daily Fluid intake: 60 oz Daily Protein intake: 60 g Bowel Habits: with the mirilax about every other day  Concerns/issues:   None stated

## 2021-01-31 ENCOUNTER — Encounter: Payer: Medicare PPO | Attending: General Surgery | Admitting: Skilled Nursing Facility1

## 2021-01-31 ENCOUNTER — Other Ambulatory Visit: Payer: Self-pay

## 2021-01-31 DIAGNOSIS — E669 Obesity, unspecified: Secondary | ICD-10-CM | POA: Diagnosis present

## 2021-01-31 DIAGNOSIS — Z6841 Body Mass Index (BMI) 40.0 and over, adult: Secondary | ICD-10-CM | POA: Insufficient documentation

## 2021-01-31 DIAGNOSIS — R7303 Prediabetes: Secondary | ICD-10-CM | POA: Diagnosis not present

## 2021-01-31 DIAGNOSIS — Z713 Dietary counseling and surveillance: Secondary | ICD-10-CM | POA: Diagnosis not present

## 2021-01-31 NOTE — Progress Notes (Signed)
Bariatric Nutrition Follow-Up Visit Medical Nutrition Therapy    NUTRITION ASSESSMENT    Surgery date: 12/05/2020 Surgery type: sleeve Start weight at [facility]: 243.2 Weight today: 226.6 pounds  Body Composition Scale 12/20/2020 01/31/2021  Current Body Weight 244.6 226.6  Total Body Fat % 48.3 46.6  Visceral Fat 21 19  Fat-Free Mass % 51.6 53.3   Total Body Water % 40.3 41.1  Muscle-Mass lbs 27.4 27.3  BMI 44.6 41.2  Body Fat Displacement           Torso  lbs 73.2 65.4         Left Leg  lbs 14.6 13         Right Leg  lbs 14.6 13         Left Arm  lbs 7.3 6.5         Right Arm   lbs 7.3 6.5   Clinical  Medical hx: panic attacks, HTN Medications: see list Labs: no updates in EMR Notable signs/symptoms: hip pain, knee pain Any previous deficiencies? No   Lifestyle & Dietary Hx  Pt wonders if she can protein chips.   Pt states she adds bacon grease and meat fat with vegetables. Pt state she is going to ask her husband to try the veggies a different way with different seasonings.  Pt states she Is worried she is not losing enough weight: Dietitian advised pt to focus on the fact she has much more energy doing more house work and has better fitting clothing.  Estimated daily fluid intake: 52 oz Estimated daily protein intake: 60 g Supplements: multi and calcium  Current average weekly physical activity: walking in the grocery store 3 times a week; chair aerobics 4 days a week   24-Hr Dietary Recall First Meal: protein shake Snack:   Second Meal: tuna Snack:  low fat yogurt Third Meal: chicken Snack: sugar free popcicle Beverages: water, powerade zero  Post-Op Goals/ Signs/ Symptoms Using straws: no Drinking while eating: no Chewing/swallowing difficulties: no Changes in vision: no Changes to mood/headaches: no Hair loss/changes to skin/nails: no Difficulty focusing/concentrating: no Sweating: no Limb weakness: no Dizziness/lightheadedness: no Palpitations: no   Carbonated/caffeinated beverages: no N/V/D/C/Gas: no; stool softeners; having a bowel movement every 2 days  Abdominal pain: no Dumping syndrome: no    NUTRITION DIAGNOSIS  Overweight/obesity ([place]-3.3) related to past poor dietary habits and physical inactivity as evidenced by completed bariatric surgery and following dietary guidelines for continued weight loss and healthy nutrition status.     NUTRITION INTERVENTION Nutrition counseling (C-1) and education (E-2) to facilitate bariatric surgery goals, including: Diet advancement to the next phase (phase 4) now including non starchy vegetables The importance of consuming adequate calories as well as certain nutrients daily due to the body's need for essential vitamins, minerals, and fats The importance of daily physical activity and to reach a goal of at least 150 minutes of moderate to vigorous physical activity weekly (or as directed by their physician) due to benefits such as increased musculature and improved lab values The importance of intuitive eating specifically learning hunger-satiety cues and understanding the importance of learning a new body: The importance of mindful eating to avoid grazing behaviors  Purpose of hydration: Water makes up over 50% of your total body water, and is part of many organs throughout the body. Water is essential to transport digested nutrients, regulate body temperature, rid the body of waste products, and protects joints and the spinal cord. When not properly hydrated you will  begin to experience headaches, cramps and dizziness. Further dehydration can result in rapid heart rate, shock, oliguria, and may cause seizures.   https://www.merckmanuals.com/home/hormonal-and-metabolic-disordehttps://www.usgs.gov/special-topic/water-science-school/science/water-you-water-and-human-body?qt-science_center_objects=0#qt-science_center_objectsrs/water-balance/about-body-water FriendLock.it InvestmentInstructor.com.cy FurEliminator.es https://www.health.BasicFM.no  Goals: -Continue to aim for a minimum of 64 fluid ounces 7 days a week with at least 30 ounces being plain water  -Eat non-starchy vegetables 2 times a day 7 days a week  -Start out with soft cooked vegetables today and tomorrow; if tolerated begin to eat raw vegetables or cooked including salads  -Eat your 3 ounces of protein first then start in on your non-starchy vegetables; once you understand how much of your meal leads to satisfaction and not full while still eating 3 ounces of protein and non-starchy vegetables you can eat them in any order   -Continue to aim for 30 minutes of activity at least 5 times a week  -Do NOT cook with/add to your food: alfredo sauce, cheese sauce, barbeque sauce, ketchup, fat back, butter, bacon grease, grease, Crisco, OR SUGAR   Handouts Provided Include  Phase 4  Learning Style & Readiness for Change Teaching method utilized: Visual & Auditory  Demonstrated degree of understanding via: Teach Back  Readiness Level: action Barriers to learning/adherence to lifestyle change: none identified   RD's Notes for Next Visit Assess adherence to pt chosen goals   MONITORING & EVALUATION Dietary intake, weekly physical activity, body weight  Next Steps Patient is to follow-up in March

## 2021-03-30 ENCOUNTER — Other Ambulatory Visit: Payer: Self-pay

## 2021-03-30 ENCOUNTER — Encounter: Payer: Medicare PPO | Attending: General Surgery | Admitting: Skilled Nursing Facility1

## 2021-03-30 DIAGNOSIS — Z713 Dietary counseling and surveillance: Secondary | ICD-10-CM | POA: Diagnosis not present

## 2021-03-30 DIAGNOSIS — Z6841 Body Mass Index (BMI) 40.0 and over, adult: Secondary | ICD-10-CM | POA: Insufficient documentation

## 2021-03-30 DIAGNOSIS — E669 Obesity, unspecified: Secondary | ICD-10-CM | POA: Insufficient documentation

## 2021-03-30 NOTE — Progress Notes (Signed)
Bariatric Nutrition Follow-Up Visit ?Medical Nutrition Therapy  ? ? ?NUTRITION ASSESSMENT ?  ? ?Surgery date: 12/05/2020 ?Surgery type: sleeve ?Start weight at [facility]: 243.2 ?Weight today: 217.3 pounds ? ?Body Composition Scale 12/20/2020 01/31/2021 03/30/2021  ?Current Body Weight 244.6 226.6 217.3  ?Total Body Fat % 48.3 46.6 45.8  ?Visceral Fat 21 19 18   ?Fat-Free Mass % 51.6 53.3 54.1  ? Total Body Water % 40.3 41.1 41.5  ?Muscle-Mass lbs 27.4 27.3 27  ?BMI 44.6 41.2 39.5  ?Body Fat Displacement     ?       Torso  lbs 73.2 65.4 61.6  ?       Left Leg  lbs 14.6 13 12.3  ?       Right Leg  lbs 14.6 13 12.3  ?       Left Arm  lbs 7.3 6.5 6.1  ?       Right Arm   lbs 7.3 6.5 6.1  ? ?Clinical  ?Medical hx: panic attacks, HTN ?Medications: see list ?Labs: no updates in EMR ?Notable signs/symptoms: hip pain, knee pain ?Any previous deficiencies? No ?  ?Lifestyle & Dietary Hx ? ? ?Due to pts insurance coverage pt will no longer be able to come for these visits advised pt she can always still call or send an email.  ? ?Pt state she is just not a breakfast or lunch person. Pt states she is not an adventurous eater. Pt states when she was 30 or 66 years old she would severely restrict her calories to the point she was passing out: Dietitian advised top please reach out if she starts to have those thoughts again.  ? ?Estimated daily fluid intake: 64 oz ?Estimated daily protein intake: 60 g ?Supplements: multi and calcium  ?Current average weekly physical activity: walking in the grocery store 3 times a week; chair aerobics 4 days a week  ? ?24-Hr Dietary Recall ?First Meal: protein shake boost ?Snack:   ?Second Meal: tuna or boost or yogurt + boost ?Snack:  low fat yogurt ?Third Meal: chicken sometimes salad  ?Snack: sugar free popcicle ?Beverages: water, powerade zero ? ?Post-Op Goals/ Signs/ Symptoms ?Using straws: no ?Drinking while eating: no ?Chewing/swallowing difficulties: no ?Changes in vision: no ?Changes to  mood/headaches: no ?Hair loss/changes to skin/nails: no ?Difficulty focusing/concentrating: no ?Sweating: no ?Limb weakness: no ?Dizziness/lightheadedness: no ?Palpitations: no  ?Carbonated/caffeinated beverages: no ?N/V/D/C/Gas: no; stool softeners; having a bowel movement every 2 days  ?Abdominal pain: no ?Dumping syndrome: no ? ?  ?NUTRITION DIAGNOSIS  ?Overweight/obesity ([place]-3.3) related to past poor dietary habits and physical inactivity as evidenced by completed bariatric surgery and following dietary guidelines for continued weight loss and healthy nutrition status. ?  ?  ?NUTRITION INTERVENTION ?Nutrition counseling (C-1) and education (E-2) to facilitate bariatric surgery goals, including: ?Diet advancement to the next phase (phase 4) now including non starchy vegetables ?The importance of consuming adequate calories as well as certain nutrients daily due to the body's need for essential vitamins, minerals, and fats ?The importance of daily physical activity and to reach a goal of at least 150 minutes of moderate to vigorous physical activity weekly (or as directed by their physician) due to benefits such as increased musculature and improved lab values ?The importance of intuitive eating specifically learning hunger-satiety cues and understanding the importance of learning a new body: The importance of mindful eating to avoid grazing behaviors  ?Purpose of hydration: Water makes up over 50% of your total body water,  and is part of many organs throughout the body. Water is essential to transport digested nutrients, regulate body temperature, rid the body of waste products, and protects joints and the spinal cord. When not properly hydrated you will begin to experience headaches, cramps and dizziness. Further dehydration can result in rapid heart rate, shock, oliguria, and may cause seizures.   ?https://www.merckmanuals.com/home/hormonal-and-metabolic-disordehttps://www.usgs.gov/special-topic/water-science-school/science/water-you-water-and-human-body?qt-science_center_objects=0#qt-science_center_objectsrs/water-balance/about-body-water ?FriendLock.it ?InvestmentInstructor.com.cy ?FurEliminator.es ?https://www.health.BasicFM.no ?Encouraged patient to honor their body's internal hunger and fullness cues.  Throughout the day, check in mentally and rate hunger. Stop eating when satisfied not full regardless of how much food is left on the plate.  Get more if still hungry 20-30 minutes later.  The key is to honor satisfaction so throughout the meal, rate fullness factor and stop when comfortably satisfied not physically full. The key is to honor hunger and fullness without any feelings of guilt or shame.  Pay attention to what the internal cues are, rather than any external factors. This will enhance the confidence you have in listening to your own body and following those internal cues enabling you to increase how often you eat when you are hungry not out of appetite and stop when you are satisfied not full.  ?Encouraged pt to continue to eat balanced meals inclusive of non starchy vegetables 2 times a day 7 days a week ?Encouraged pt to choose lean protein sources: limiting beef, pork, sausage, hotdogs, and lunch meat ?Encourage pt to choose healthy fats such as plant based limiting animal fats ?Encouraged pt to continue to drink a minium 64 fluid ounces with half being plain water to satisfy proper hydration  ? ?Goals: ?-add in light weights  ?-nutritious meals are your focus  ?-boost is a source of a significant amount of sugar ? ? ?Handouts Provided Include  ?Phase 7 ? ?Learning Style & Readiness for  Change ?Teaching method utilized: Visual & Auditory  ?Demonstrated degree of understanding via: Teach Back  ?Readiness Level: action ?Barriers to learning/adherence to lifestyle change: none identified  ? ?RD's Notes for Next Visit ?Assess adherence to pt chosen goals ? ?

## 2021-04-03 ENCOUNTER — Ambulatory Visit: Payer: Medicare PPO | Admitting: Skilled Nursing Facility1

## 2022-08-21 IMAGING — RF DG UGI W SINGLE CM
12 of 20 series · 13 of 24 positions shown · non-contrast
Comparison: None.

CLINICAL DATA: Morbid obesity (HCC), preop for gastric sleeve

EXAM:
UPPER GI SERIES WITH KUB
TECHNIQUE: After obtaining a scout radiograph a routine upper GI series was
performed using thin barium
FLUOROSCOPY TIME:  Fluoroscopy Time:  4 minutes
Radiation Exposure Index (if provided by the fluoroscopic device):
237.7 mGy
Number of Acquired Spot Images: 8

[Series 1: t abdomen supine · 0.15mm/px · 1 of 1 slices shown]
[im 1/1]
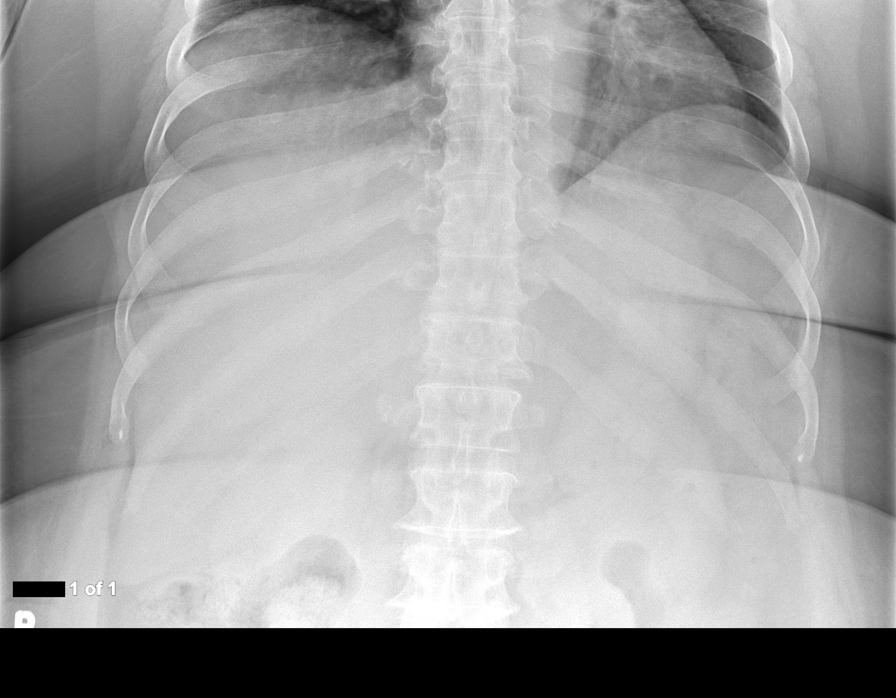

[Series 4: fluoro_barium 2fps_bw · 0.18mm/px · 1 of 2 frames shown (1 of 2)]
[frame 1/2]
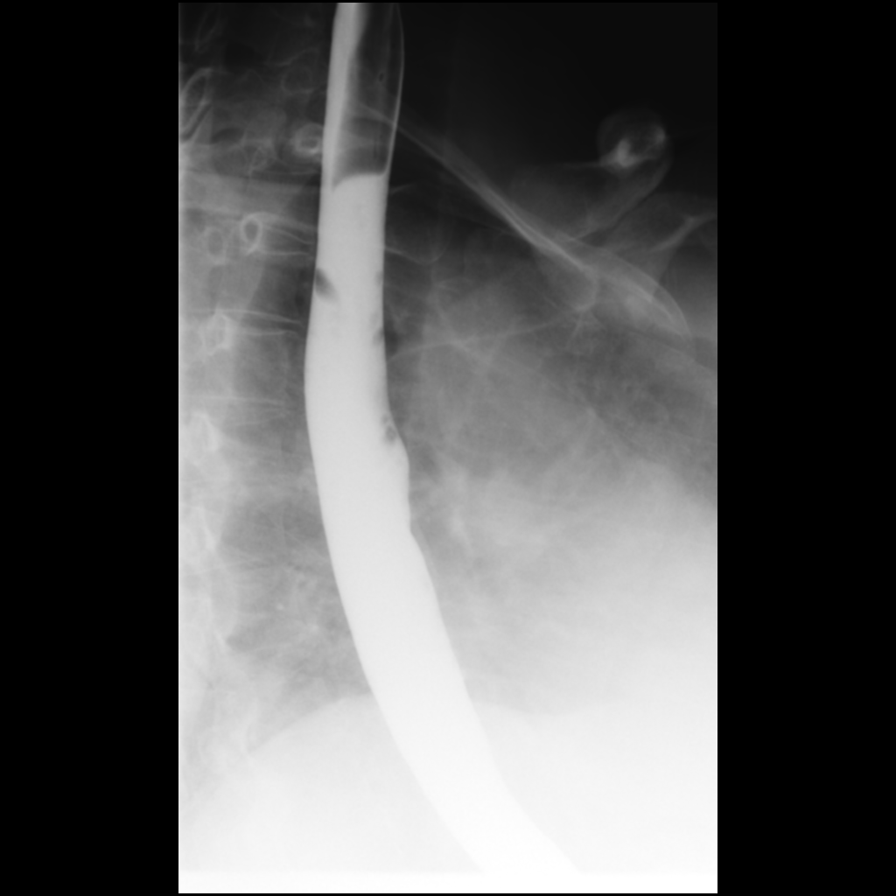

[Series 6: cp_standard · 0.34mm/px · 1 of 145 frames shown (1 of 9)]
[frame 124/145]
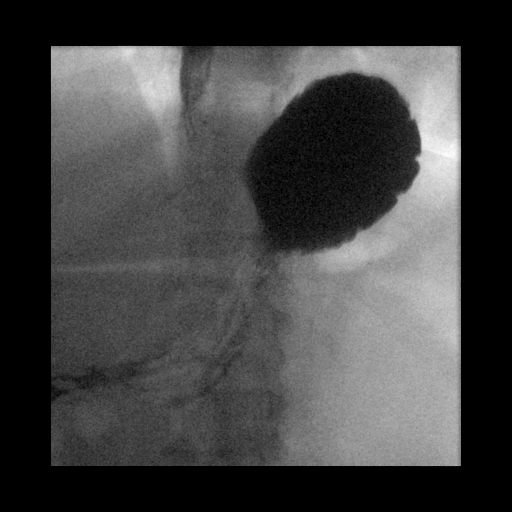

[Series 8: cp_standard · 0.34mm/px · 1 of 39 frames shown (2 of 9)]
[frame 18/39]
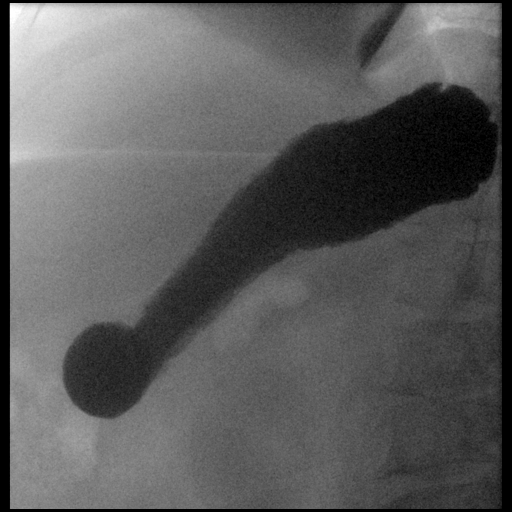

[Series 10: cp_standard · 0.34mm/px · 1 of 75 frames shown (3 of 9)]
[frame 38/75]
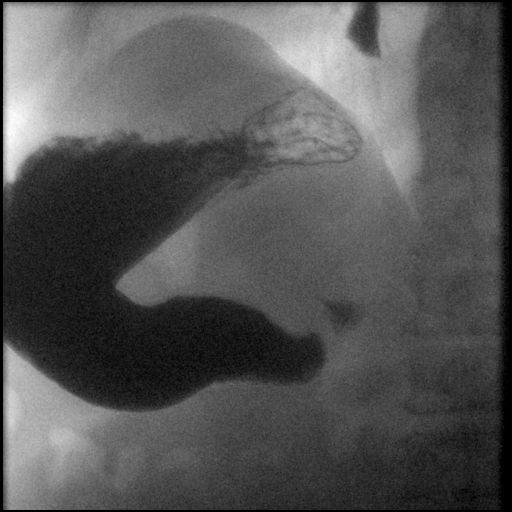

[Series 13: cp_standard · 0.34mm/px · 1 of 69 frames shown (4 of 9)]
[frame 49/69]
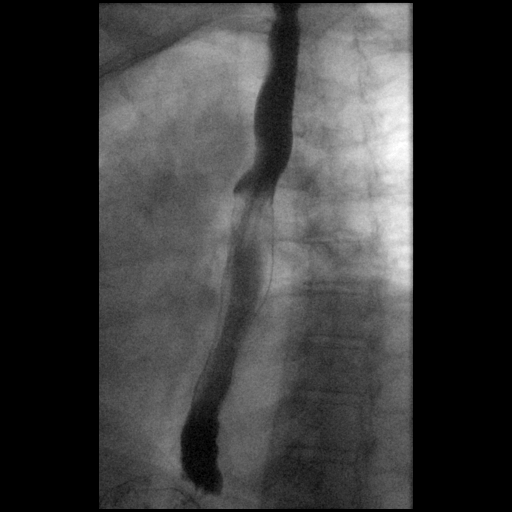

[Series 15: cp_standard · 0.35mm/px · 2 of 192 frames shown (5 of 9)]
[frame 29/192]
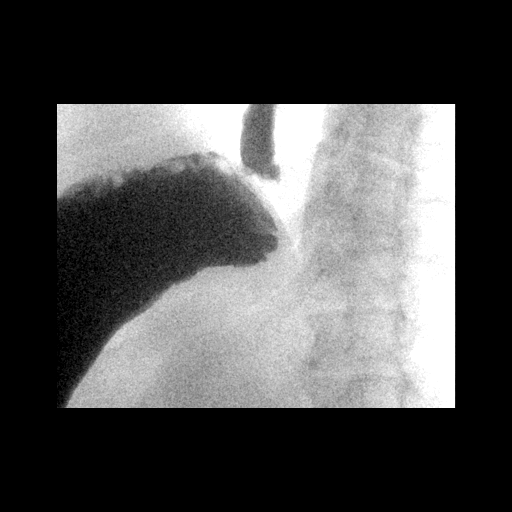
[frame 187/192]
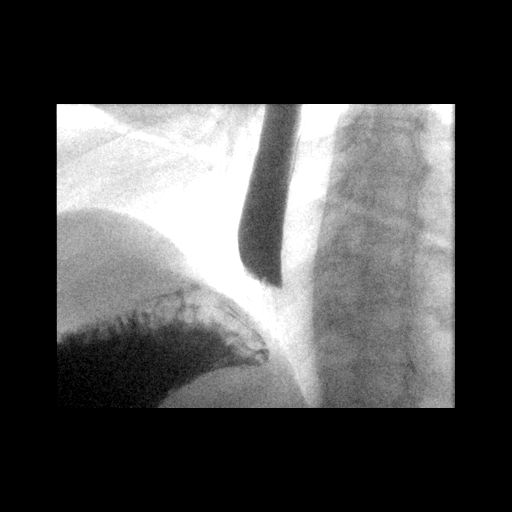

[Series 17: cp_standard · 0.34mm/px · 1 of 55 frames shown (6 of 9)]
[frame 9/55]
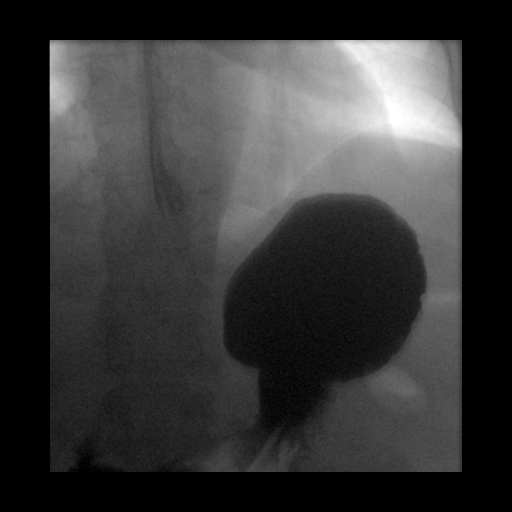

[Series 18: cp_standard · 0.35mm/px · 1 of 66 frames shown (7 of 9)]
[frame 57/66]
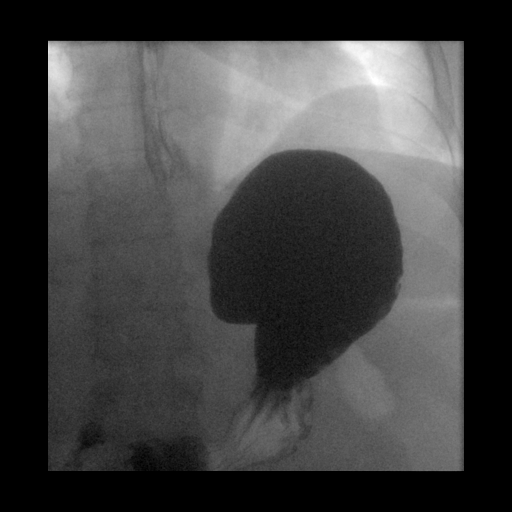

[Series 20: cp_standard · 0.35mm/px · 1 of 187 frames shown (8 of 9)]
[frame 53/187]
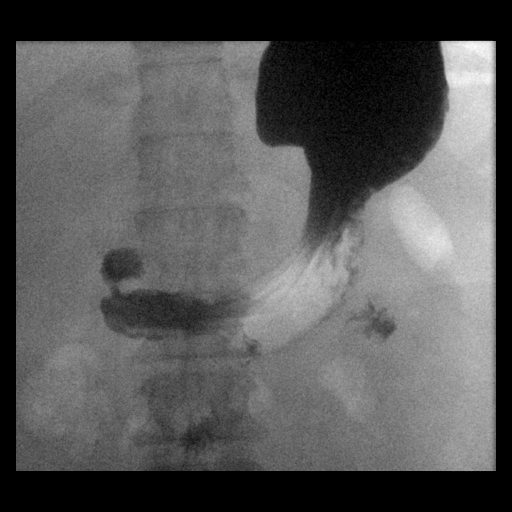

[Series 21: cp_standard · 0.37mm/px · 1 of 87 frames shown (9 of 9)]
[frame 74/87]
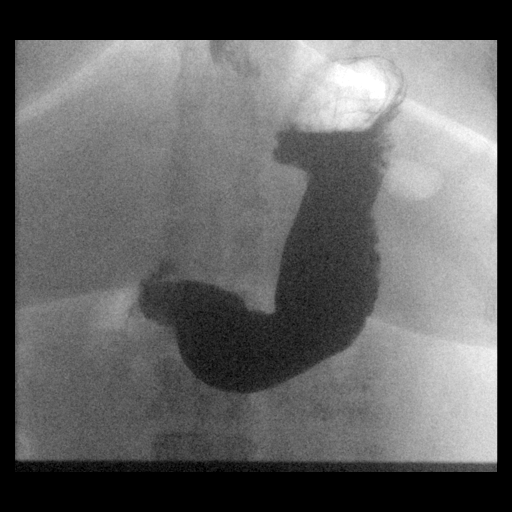

[Series 24: fluoro_barium 2fps_bw · 0.18mm/px · 1 of 1 slices shown (2 of 2)]
[im 1/1]
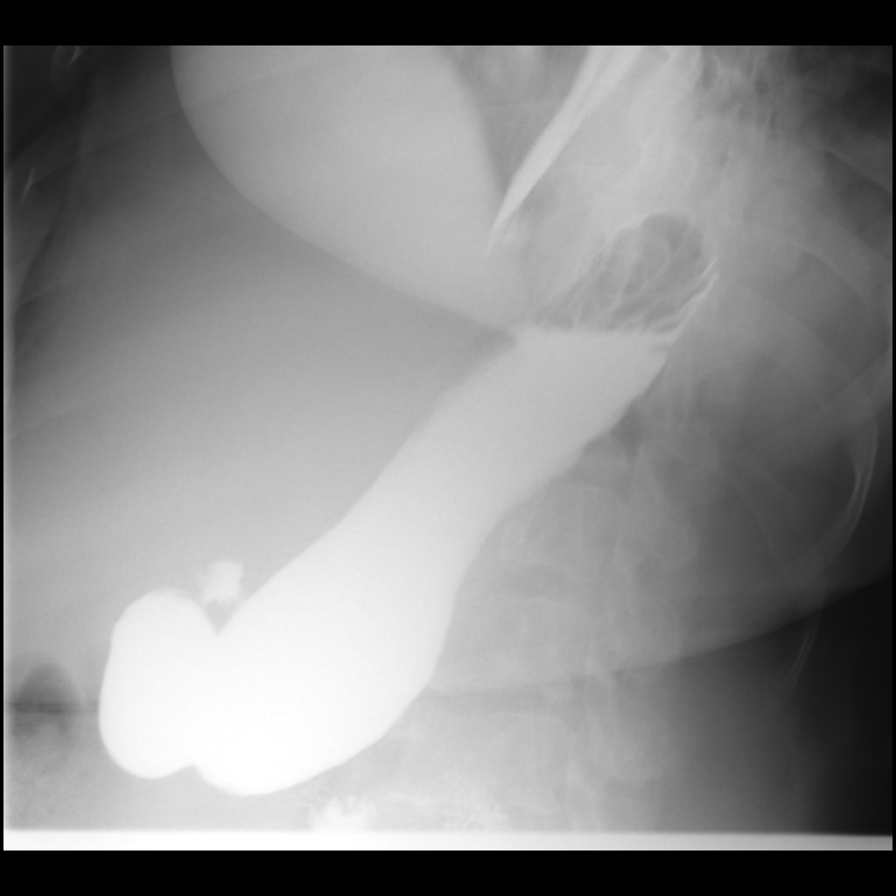

[13 of 24 positions shown; findings below may reference images not displayed]

FINDINGS: Moderate esophageal dysmotility, with delayed progression of
contrast in the esophagus to the stomach.

Otherwise normal esophageal course and contour without evidence of
stricture or visible ulceration.

Prominent esophageal vestibule. No definite hiatal hernia. No
gastroesophageal reflux occurred spontaneously or was elicited.

A 13 mm barium tablet passed without difficulty into the stomach.

Gastric morphology and appearance appear normal. Proximal duodenum
appears normal.
IMPRESSION: Moderate esophageal dysmotility. Prominent esophageal vestibule
without definite hiatal hernia.

Normal gastric morphology and proximal duodenum.

## 2023-04-10 ENCOUNTER — Other Ambulatory Visit: Payer: Self-pay | Admitting: Nurse Practitioner

## 2023-04-10 DIAGNOSIS — R12 Heartburn: Secondary | ICD-10-CM

## 2023-04-10 DIAGNOSIS — Z903 Acquired absence of stomach [part of]: Secondary | ICD-10-CM

## 2023-04-11 ENCOUNTER — Other Ambulatory Visit: Payer: Self-pay | Admitting: General Surgery

## 2023-04-11 DIAGNOSIS — Z903 Acquired absence of stomach [part of]: Secondary | ICD-10-CM

## 2023-04-11 DIAGNOSIS — R12 Heartburn: Secondary | ICD-10-CM

## 2023-05-07 ENCOUNTER — Ambulatory Visit
Admission: RE | Admit: 2023-05-07 | Discharge: 2023-05-07 | Disposition: A | Discharge status: Home / Self Care | Source: Ambulatory Visit | Attending: Nurse Practitioner | Admitting: Nurse Practitioner

## 2023-05-07 DIAGNOSIS — Z903 Acquired absence of stomach [part of]: Secondary | ICD-10-CM

## 2023-05-07 DIAGNOSIS — R12 Heartburn: Secondary | ICD-10-CM
